# Patient Record
Sex: Male | Born: 1980 | Race: White | Hispanic: No | Marital: Married | Smoking: Current every day smoker
Health system: Southern US, Community
[De-identification: ages and names within clinical notes are randomized; demographics above are authoritative.]

## PROBLEM LIST (undated history)

## (undated) HISTORY — PX: OTHER SURGICAL HISTORY: SHX169

## (undated) HISTORY — PX: TONSILLECTOMY: SUR1361

---

## 2011-04-07 ENCOUNTER — Emergency Department: Payer: Self-pay | Admitting: Emergency Medicine

## 2018-01-31 ENCOUNTER — Emergency Department (HOSPITAL_COMMUNITY): Payer: Self-pay

## 2018-01-31 ENCOUNTER — Encounter (HOSPITAL_COMMUNITY): Payer: Self-pay | Admitting: Emergency Medicine

## 2018-01-31 ENCOUNTER — Other Ambulatory Visit: Payer: Self-pay

## 2018-01-31 ENCOUNTER — Emergency Department (HOSPITAL_COMMUNITY)
Admission: EM | Admit: 2018-01-31 | Discharge: 2018-01-31 | Disposition: A | Discharge status: Home / Self Care | Payer: Self-pay | Attending: Emergency Medicine | Admitting: Emergency Medicine

## 2018-01-31 DIAGNOSIS — F17200 Nicotine dependence, unspecified, uncomplicated: Secondary | ICD-10-CM | POA: Insufficient documentation

## 2018-01-31 DIAGNOSIS — M25561 Pain in right knee: Secondary | ICD-10-CM | POA: Insufficient documentation

## 2018-01-31 DIAGNOSIS — G8929 Other chronic pain: Secondary | ICD-10-CM

## 2018-01-31 MED ORDER — NAPROXEN 375 MG PO TABS: 375.0000 mg | ORAL_TABLET | Freq: Two times a day (BID) | ORAL | 0 refills | Status: DC

## 2018-01-31 NOTE — ED Notes (Signed)
Pt able to ambulate from triage to room assigned

## 2018-01-31 NOTE — ED Provider Notes (Signed)
[facility] EMERGENCY DEPARTMENT Provider Note   CSN: [id number] Arrival date & time: 01/31/18  1137     History   Chief Complaint Chief Complaint  Patient presents with  . Knee Pain    HPI Glen West is a 37 y.o. male with no significant past medical history presents emergency department today for right knee pain x1 year.  Patient states that he is unsure of any preceding trauma, falls.  He reports that over the last 1 year his knee has been popping with walking and he also feels a clicking sensation.  He denies ever giving way on him.  He reports it is worse when he squats down.  He has not tried anything for his symptoms.  He reports that he came in today because his girlfriend made him.  He is never followed up with anybody for this or been seen by anybody.  He denies any fever, chills, IV drug use, lower leg swelling, numbness/tingling/weakness.  HPI  History reviewed. No pertinent past medical history.  There are no active problems to display for this patient.   Past Surgical History:  Procedure Laterality Date  . left arm surgery          Home Medications    Prior to Admission medications   Not on File    Family History History reviewed. No pertinent family history.  Social History Social History   Tobacco Use  . Smoking status: Current Every Day Smoker  . Smokeless tobacco: Never Used  Substance Use Topics  . Alcohol use: Yes    Comment: daily/beer  . Drug use: Not Currently    Types: Marijuana     Allergies   Patient has no allergy information on record.   Review of Systems Review of Systems  All other systems reviewed and are negative.    Physical Exam Updated Vital Signs BP 131/82 (BP Location: Right Arm)   Pulse 60   Temp 97.6 F (36.4 C) (Oral)   Resp 16   Ht  (1.854 m)   Wt 79.4 kg (175 lb)   SpO2 100%   BMI 23.09 kg/m   Physical Exam  Constitutional: He appears well-developed and well-nourished.  HENT:  Head:  Normocephalic and atraumatic.  Right Ear: External ear normal.  Left Ear: External ear normal.  Eyes: Conjunctivae are normal. Right eye exhibits no discharge. Left eye exhibits no discharge. No scleral icterus.  Cardiovascular:  Pulses:      Dorsalis pedis pulses are 2+ on the right side.       Posterior tibial pulses are 2+ on the right side.  No lower extremity edema  Pulmonary/Chest: Effort normal. No respiratory distress.  Musculoskeletal:  Mild medial joint effusion. No obvious deformity. No skin swelling, erythema, heat, fluctuance or break of the skin. TTP over medial joint line. Active and passive flexion and extension intact and full. Negative Lachman's test. Negative anterior/poster drawer bilaterally. Equivocal McMurray's test. No varus or valgus laxity or locking. No TTP of hips or ankles. Compartments soft. Neurovascularly intact distally to site of injury.   Neurological: He is alert. He has normal strength. No sensory deficit.  Skin: Skin is warm, dry and intact. Capillary refill takes less than 2 seconds. No erythema. No pallor.  No overlying erythema or heat.  No drainage.  Psychiatric: He has a normal mood and affect.  Nursing note and vitals reviewed.    ED Treatments / Results  Labs (all labs ordered are listed, but only  abnormal results are displayed) Labs Reviewed - No data to display  EKG None  Radiology Dg Knee Complete 4 Views Right  Result Date: 01/31/2018 CLINICAL DATA:  Medial right knee pain and swelling.  No injury. EXAM: RIGHT KNEE - COMPLETE 4+ VIEW COMPARISON:  None. FINDINGS: No acute fracture or dislocation. Probable small joint effusion. Joint spaces are preserved. Bone mineralization is normal. Soft tissues are unremarkable. IMPRESSION: 1. Small joint effusion.  No acute osseous abnormality. Electronically Signed   By: Obie Dredge M.D.   On: 01/31/2018 12:13    Procedures Procedures (including critical care time)  Medications Ordered in  ED Medications - No data to display   Initial Impression / Assessment and Plan / ED Course  I have reviewed the triage vital signs and the nursing notes.  Pertinent labs & imaging results that were available during my care of the patient were reviewed by me and considered in my medical decision making (see chart for details).     37 y.o. male with right knee pain x 1 year with clicking and popping. He is NVI. Compartments are soft. Patient without evidence of septic joint with normal rom, no overlying erythema or heat and afebrile. Patient X-Ray negative for obvious fracture or dislocation. Possible meniscus injury but will refer to orthopedics for further evaluation. Patient given brace while in ED, conservative therapy recommended and discussed. Patient will be dc home & is agreeable with above plan.  Final Clinical Impressions(s) / ED Diagnoses   Final diagnoses:  Chronic pain of right knee    ED Discharge Orders        Ordered    naproxen (NAPROSYN) 375 MG tablet  2 times daily     01/31/18 1306       Princella Pellegrini 01/31/18 1308    Donnetta Hutching, MD 02/01/18 [phone]

## 2018-01-31 NOTE — ED Triage Notes (Signed)
Pt c/o right knee pain. Heard a pop yesterday. Denies injury. Hx of

## 2018-01-31 NOTE — Discharge Instructions (Addendum)
Your xray was negative for fracture. Your exam is not consistent with an infected joint.  It is possible this may be related to the cartilage in your knee.  You will have to follow with orthopedics for further evaluation of this.  I provided you with a knee brace in the department.  Please take medication as prescribed. If you develop worsening or new concerning symptoms you can return to the emergency department for re-evaluation.

## 2018-01-31 NOTE — ED Notes (Signed)
Pt states right knee has been popping for a year, pt denies any known injury to knee. Ice pack applied.

## 2018-01-31 NOTE — ED Notes (Signed)
Patient transported to X-ray 

## 2019-04-19 ENCOUNTER — Emergency Department
Admission: EM | Admit: 2019-04-19 | Discharge: 2019-04-19 | Disposition: A | Discharge status: Home / Self Care | Payer: 59 | Attending: Student in an Organized Health Care Education/Training Program | Admitting: Student in an Organized Health Care Education/Training Program

## 2019-04-19 ENCOUNTER — Emergency Department: Payer: 59

## 2019-04-19 ENCOUNTER — Other Ambulatory Visit: Payer: Self-pay

## 2019-04-19 ENCOUNTER — Encounter: Payer: Self-pay | Admitting: Emergency Medicine

## 2019-04-19 DIAGNOSIS — Y9289 Other specified places as the place of occurrence of the external cause: Secondary | ICD-10-CM | POA: Diagnosis not present

## 2019-04-19 DIAGNOSIS — S6992XA Unspecified injury of left wrist, hand and finger(s), initial encounter: Secondary | ICD-10-CM | POA: Diagnosis present

## 2019-04-19 DIAGNOSIS — S61211A Laceration without foreign body of left index finger without damage to nail, initial encounter: Secondary | ICD-10-CM | POA: Diagnosis not present

## 2019-04-19 DIAGNOSIS — W312XXA Contact with powered woodworking and forming machines, initial encounter: Secondary | ICD-10-CM | POA: Diagnosis not present

## 2019-04-19 DIAGNOSIS — Y998 Other external cause status: Secondary | ICD-10-CM | POA: Diagnosis not present

## 2019-04-19 DIAGNOSIS — F17200 Nicotine dependence, unspecified, uncomplicated: Secondary | ICD-10-CM | POA: Insufficient documentation

## 2019-04-19 DIAGNOSIS — Y9389 Activity, other specified: Secondary | ICD-10-CM | POA: Insufficient documentation

## 2019-04-19 DIAGNOSIS — Z23 Encounter for immunization: Secondary | ICD-10-CM | POA: Diagnosis not present

## 2019-04-19 MED ORDER — BUPIVACAINE HCL (PF) 0.5 % IJ SOLN
30.0000 mL | Freq: Once | INTRAMUSCULAR | Status: AC
  Administered 2019-04-19: 30 mL
  Filled 2019-04-19: qty 30

## 2019-04-19 MED ORDER — CEPHALEXIN 500 MG PO CAPS
500.0000 mg | ORAL_CAPSULE | Freq: Once | ORAL | Status: AC
  Administered 2019-04-19: 500 mg via ORAL
  Filled 2019-04-19: qty 1

## 2019-04-19 MED ORDER — CEPHALEXIN 500 MG PO CAPS: 500.0000 mg | ORAL_CAPSULE | Freq: Three times a day (TID) | ORAL | 0 refills | Status: AC

## 2019-04-19 MED ORDER — TETANUS-DIPHTH-ACELL PERTUSSIS 5-2.5-18.5 LF-MCG/0.5 IM SUSP
0.5000 mL | Freq: Once | INTRAMUSCULAR | Status: AC
  Administered 2019-04-19: 04:00:00 0.5 mL via INTRAMUSCULAR
  Filled 2019-04-19: qty 0.5

## 2019-04-19 MED ORDER — CEPHALEXIN 500 MG PO CAPS: 500.0000 mg | ORAL_CAPSULE | Freq: Three times a day (TID) | ORAL | 0 refills | Status: DC

## 2019-04-19 MED ORDER — BACITRACIN ZINC 500 UNIT/GM EX OINT
TOPICAL_OINTMENT | Freq: Once | CUTANEOUS | Status: AC
  Administered 2019-04-19: 1 via TOPICAL
  Filled 2019-04-19: qty 0.9

## 2019-04-19 MED ORDER — OXYCODONE-ACETAMINOPHEN 5-325 MG PO TABS
1.0000 | ORAL_TABLET | Freq: Once | ORAL | Status: AC
  Administered 2019-04-19: 04:00:00 1 via ORAL
  Filled 2019-04-19: qty 1

## 2019-04-19 NOTE — ED Triage Notes (Signed)
Patient states that he cut his left second finger on a saw about an hour ago. Bleeding controlled at this time.

## 2019-04-19 NOTE — ED Provider Notes (Signed)
[facility] Emergency Department Provider Note    First MD Initiated Contact with Patient 04/19/19 [phone]     (approximate)  I have reviewed the triage vital signs and the nursing notes.   HISTORY  Chief Complaint Laceration    HPI Glen West is a 38 y.o. male presents for evaluation of injury to nondominant left index finger that was sustained tonight around midnight as the patient was cutting copper piping with a band saw.  States the pain is mild to moderate.  Did have some tingling but does not have any numbness or tingling to the left finger at this time.  Tetanus is not up-to-date.  History reviewed. No pertinent past medical history. No family history on file. Past Surgical History:  Procedure Laterality Date  . left arm surgery     There are no active problems to display for this patient.     Prior to Admission medications   Medication Sig Start Date End Date Taking? Authorizing Provider  cephALEXin (KEFLEX) 500 MG capsule Take 1 capsule (500 mg total) by mouth 3 (three) times daily for 7 days. 04/19/19 04/26/19  Merlyn Lot, MD  naproxen (NAPROSYN) 375 MG tablet Take 1 tablet (375 mg total) by mouth 2 (two) times daily. 01/31/18   Maczis, Barth Kirks, PA-C    Allergies Patient has no known allergies.    Social History Social History   Tobacco Use  . Smoking status: Current Every Day Smoker  . Smokeless tobacco: Never Used  Substance Use Topics  . Alcohol use: Yes    Comment: 6 pack of beer daily  . Drug use: Not Currently    Types: Marijuana    Review of Systems Patient denies headaches, rhinorrhea, blurry vision, numbness, shortness of breath, chest pain, edema, cough, abdominal pain, nausea, vomiting, diarrhea, dysuria, fevers, rashes or hallucinations unless otherwise stated above in HPI. ____________________________________________   PHYSICAL EXAM:  VITAL SIGNS: Vitals:   04/19/19 0121  BP: (!) 154/85  Pulse:  76  Resp: 18  Temp: (!) 97.4 F (36.3 C)  SpO2: 99%    Constitutional: Alert and oriented. Well appearing and in no acute distress. Eyes: Conjunctivae are normal.  Head: Atraumatic. Nose: No congestion/rhinnorhea. Mouth/Throat: Mucous membranes are moist.   Neck: Painless ROM.  Cardiovascular:   Good peripheral circulation. Respiratory: Normal respiratory effort.  No retractions.  Gastrointestinal: Soft and nontender.  Musculoskeletal: 2 cm laceration over the radial aspect of the proximal PIP joint the left hand.  Flexor and extensor mechanism is intact.  Neurovascularly intact distally.  No foreign bodies identified. Neurologic:  Normal speech and language. No gross focal neurologic deficits are appreciated.  Skin:  Skin is warm, dry and intact. No rash noted. Psychiatric: Mood and affect are normal. Speech and behavior are normal.  ____________________________________________   LABS (all labs ordered are listed, but only abnormal results are displayed)  No results found for this or any previous visit (from the past 24 hour(s)). ____________________________________________ ____________________________________________  [id number]  I personally reviewed all radiographic images ordered to evaluate for the above acute complaints and reviewed radiology reports and findings.  These findings were personally discussed with the patient.  Please see medical record for radiology report.  ____________________________________________   PROCEDURES  Procedure(s) performed:  Marland KitchenMarland KitchenLaceration Repair  Date/Time: 04/19/2019 4:22 AM Performed by: Merlyn Lot, MD Authorized by: Merlyn Lot, MD   Consent:    Consent obtained:  Verbal   Consent given by:  Patient   Risks  discussed:  Infection, nerve damage, need for additional repair, poor wound healing and poor cosmetic result Anesthesia (see MAR for exact dosages):    Anesthesia method:  Nerve block   Block needle gauge:  25 G    Block anesthetic:  Bupivacaine 0.5% w/o epi   Block technique:  Ring   Block injection procedure:  Introduced needle   Block outcome:  Anesthesia achieved Laceration details:    Location:  Finger   Finger location:  L index finger   Length (cm):  2   Depth (mm):  4 Repair type:    Repair type:  Intermediate Exploration:    Wound exploration: wound explored through full range of motion     Contaminated: yes   Treatment:    Area cleansed with:  Betadine   Amount of cleaning:  Extensive   Irrigation solution:  Sterile saline   Irrigation method:  Syringe   Visualized foreign bodies/material removed: no   Skin repair:    Repair method:  Sutures   Suture size:  5-0   Suture material:  Nylon   Suture technique:  Simple interrupted   Number of sutures:  5 Approximation:    Approximation:  Loose Post-procedure details:    Dressing:  Antibiotic ointment      Critical Care performed: no ____________________________________________   INITIAL IMPRESSION / ASSESSMENT AND PLAN / ED COURSE  Pertinent labs & imaging results that were available during my care of the patient were reviewed by me and considered in my medical decision making (see chart for details).  DDX: laceration, fracture, fb  Cheron SchaumannCurtis W Havens is a 38 y.o. who presents to the ED with laceration as described above tetanus was updated.  Extensor and flexion mechanisms are intact.  Have a high suspicion that this did violate the joint capsule therefore it was extensively irrigated with Betadine and saline.  Loosely approximated.  Will be put on antibiotics.  Will give referral to Ortho hand for close outpatient follow-up.    The patient was evaluated in Emergency Department today for the symptoms described in the history of present illness. He/she was evaluated in the context of the global COVID-19 pandemic, which necessitated consideration that the patient might be at risk for infection with the SARS-CoV-2 virus that  causes COVID-19. Institutional protocols and algorithms that pertain to the evaluation of patients at risk for COVID-19 are in a state of rapid change based on information released by regulatory bodies including the CDC and federal and state organizations. These policies and algorithms were followed during the patient's care in the ED.    ____________________________________________   FINAL CLINICAL IMPRESSION(S) / ED DIAGNOSES  Final diagnoses:  Laceration of left index finger without damage to nail, foreign body presence unspecified, initial encounter      NEW MEDICATIONS STARTED DURING THIS VISIT:  New Prescriptions   CEPHALEXIN (KEFLEX) 500 MG CAPSULE    Take 1 capsule (500 mg total) by mouth 3 (three) times daily for 7 days.     Note:  This document was prepared using Dragon voice recognition software and may include unintentional dictation errors.     Willy Eddyobinson, Llewellyn Choplin, MD 04/19/19 Glynis [phone]

## 2019-04-19 NOTE — Discharge Instructions (Signed)
[facility] for follow up with [facility] specialist.

## 2019-04-19 NOTE — ED Notes (Signed)
Pt informed he cannot drive for 4 hours after percocet administration. Pt verbalizes understanding, states has a driver.

## 2019-05-26 ENCOUNTER — Other Ambulatory Visit: Payer: Self-pay

## 2019-05-26 ENCOUNTER — Ambulatory Visit
Admission: EM | Admit: 2019-05-26 | Discharge: 2019-05-26 | Disposition: A | Discharge status: Home / Self Care | Payer: 59 | Attending: Emergency Medicine | Admitting: Emergency Medicine

## 2019-05-26 DIAGNOSIS — Z20822 Contact with and (suspected) exposure to covid-19: Secondary | ICD-10-CM

## 2019-05-26 DIAGNOSIS — R0981 Nasal congestion: Secondary | ICD-10-CM

## 2019-05-26 DIAGNOSIS — R05 Cough: Secondary | ICD-10-CM

## 2019-05-26 DIAGNOSIS — Z20828 Contact with and (suspected) exposure to other viral communicable diseases: Secondary | ICD-10-CM | POA: Diagnosis not present

## 2019-05-26 DIAGNOSIS — J069 Acute upper respiratory infection, unspecified: Secondary | ICD-10-CM

## 2019-05-26 MED ORDER — FLUTICASONE PROPIONATE 50 MCG/ACT NA SUSP: 2.0000 | Freq: Every day | NASAL | 0 refills | Status: DC

## 2019-05-26 MED ORDER — BENZONATATE 100 MG PO CAPS: 100.0000 mg | ORAL_CAPSULE | Freq: Three times a day (TID) | ORAL | 0 refills | Status: DC

## 2019-05-26 MED ORDER — CETIRIZINE-PSEUDOEPHEDRINE ER 5-120 MG PO TB12: 1.0000 | ORAL_TABLET | Freq: Every day | ORAL | 0 refills | Status: DC

## 2019-05-26 NOTE — ED Provider Notes (Signed)
[facility]   [id number] 05/26/19 Arrival Time: 9379   CC: URI symptoms; COVID testing  SUBJECTIVE: History from: patient.  Glen West is a 38 y.o. male who presents with runny nose, congestion, sore throat, and dry cough x 6 days.  Denies sick exposure to COVID, flu or strep.  Denies recent travel.  Has NOT tried OTC medications.  Denies aggravating factors.  Reports previous symptoms in the past related to a cold.   Complains of subjective fever, fatigue, mild wheezes and SOB.  Denies chest pain, chest pressure, nausea, vomiting, changes in bowel or bladder habits.    ROS: As per HPI.  All other pertinent ROS negative.     History reviewed. No pertinent past medical history. Past Surgical History:  Procedure Laterality Date  . left arm surgery     No Known Allergies No current facility-administered medications on file prior to encounter.    No current outpatient medications on file prior to encounter.   Social History   Socioeconomic History  . Marital status: Single    Spouse name: Not on file  . Number of children: Not on file  . Years of education: Not on file  . Highest education level: Not on file  Occupational History  . Not on file  Social Needs  . Financial resource strain: Not on file  . Food insecurity    Worry: Not on file    Inability: Not on file  . Transportation needs    Medical: Not on file    Non-medical: Not on file  Tobacco Use  . Smoking status: Current Every Day Smoker  . Smokeless tobacco: Never Used  Substance and Sexual Activity  . Alcohol use: Yes    Comment: 6 pack of beer daily  . Drug use: Not Currently    Types: Marijuana  . Sexual activity: Not on file  Lifestyle  . Physical activity    Days per week: Not on file    Minutes per session: Not on file  . Stress: Not on file  Relationships  . Social Herbalist on phone: Not on file    Gets together: Not on file    Attends religious service: Not on file     Active member of club or organization: Not on file    Attends meetings of clubs or organizations: Not on file    Relationship status: Not on file  . Intimate partner violence    Fear of current or ex partner: Not on file    Emotionally abused: Not on file    Physically abused: Not on file    Forced sexual activity: Not on file  Other Topics Concern  . Not on file  Social History Narrative  . Not on file   History reviewed. No pertinent family history.  OBJECTIVE:  Vitals:   05/26/19 1104  BP: 124/81  Pulse: 80  Resp: 18  Temp: 97.7 F (36.5 C)  SpO2: 97%    General appearance: alert; appears fatigued, but nontoxic; speaking in full sentences and tolerating own secretions HEENT: NCAT; Ears: EACs clear, TMs pearly gray; Eyes: PERRL.  EOM grossly intact. Nose: nares patent without rhinorrhea, Dentition: poor;  Throat: oropharynx clear, tonsils non erythematous or enlarged, uvula midline  Neck: supple without LAD Lungs: unlabored respirations, symmetrical air entry; cough: mild; no respiratory distress; diffuse expiratory wheezes and crackles about bilateral lung fields Heart: regular rate and rhythm.  Radial pulses 2+ symmetrical bilaterally. Cap refill <  2 seconds Skin: warm and dry Psychological: alert and cooperative; normal mood and affect  ASSESSMENT & PLAN:  1. Suspected Covid-19 Virus Infection   2. Viral URI with cough     Meds ordered this encounter  Medications  . cetirizine-pseudoephedrine (ZYRTEC-D) 5-120 MG tablet    Sig: Take 1 tablet by mouth daily.    Dispense:  30 tablet    Refill:  0    Order Specific Question:   Supervising Provider    Answer:   Eustace MooreNELSON, YVONNE SUE [[id number]]  . fluticasone (FLONASE) 50 MCG/ACT nasal spray    Sig: Place 2 sprays into both nostrils daily.    Dispense:  16 g    Refill:  0    Order Specific Question:   Supervising Provider    Answer:   Eustace MooreNELSON, YVONNE SUE [[id number]]  . benzonatate (TESSALON) 100 MG capsule    Sig:  Take 1 capsule (100 mg total) by mouth every 8 (eight) hours.    Dispense:  21 capsule    Refill:  0    Order Specific Question:   Supervising Provider    Answer:   Eustace MooreELSON, YVONNE SUE [[id number]]    COVID testing ordered.    In the meantime: You should remain isolated in your home for 10 days from symptom onset AND greater than 72 hours after symptoms resolution (absence of fever without the use of fever-reducing medication and improvement in respiratory symptoms), whichever is longer Get plenty of rest and push fluids Tessalon Perles prescribed for cough Zyrtec-D prescribed for nasal congestion, runny nose, and/or sore throat Flonase prescribed for nasal congestion and runny nose Use medications daily for symptom relief Use OTC medications like ibuprofen or tylenol as needed fever or pain Follow up with PCP as needed Call or go to the ED if you have any new or worsening symptoms such as fever, worsening cough, shortness of breath, chest tightness, chest pain, turning blue, changes in mental status, etc...  Instructed patient to return in 1-2 days if symptoms were not improving.  May need a chest x-ray at that time.     Reviewed expectations re: course of current medical issues. Questions answered. Outlined signs and symptoms indicating need for more acute intervention. Patient verbalized understanding. After Visit Summary given.         Rennis HardingWurst, Glen Brissette, PA-C 05/26/19 1504

## 2019-05-26 NOTE — ED Triage Notes (Signed)
Pt having cough and nasal congestion for past week wants covid testing

## 2019-05-26 NOTE — Discharge Instructions (Signed)
COVID testing ordered.    In the meantime: You should remain isolated in your home for 10 days from symptom onset AND greater than 72 hours after symptoms resolution (absence of fever without the use of fever-reducing medication and improvement in respiratory symptoms), whichever is longer Get plenty of rest and push fluids Tessalon Perles prescribed for cough Zyrtec-D prescribed for nasal congestion, runny nose, and/or sore throat Flonase prescribed for nasal congestion and runny nose Use medications daily for symptom relief Use OTC medications like ibuprofen or tylenol as needed fever or pain Follow up with PCP as needed Call or go to the ED if you have any new or worsening symptoms such as fever, worsening cough, shortness of breath, chest tightness, chest pain, turning blue, changes in mental status, etc..Marland Kitchen

## 2019-05-27 LAB — NOVEL CORONAVIRUS, NAA: SARS-CoV-2, NAA: NOT DETECTED

## 2019-05-28 ENCOUNTER — Encounter (HOSPITAL_COMMUNITY): Payer: Self-pay

## 2019-07-04 ENCOUNTER — Other Ambulatory Visit: Payer: Self-pay

## 2019-07-04 DIAGNOSIS — Z20822 Contact with and (suspected) exposure to covid-19: Secondary | ICD-10-CM

## 2019-07-06 LAB — NOVEL CORONAVIRUS, NAA: SARS-CoV-2, NAA: NOT DETECTED

## 2020-08-17 ENCOUNTER — Emergency Department (HOSPITAL_COMMUNITY)
Admission: EM | Admit: 2020-08-17 | Discharge: 2020-08-17 | Disposition: A | Discharge status: Home / Self Care | Payer: BC Managed Care – PPO | Attending: Emergency Medicine | Admitting: Emergency Medicine

## 2020-08-17 ENCOUNTER — Encounter (HOSPITAL_COMMUNITY): Payer: Self-pay | Admitting: *Deleted

## 2020-08-17 ENCOUNTER — Emergency Department (HOSPITAL_COMMUNITY): Payer: BC Managed Care – PPO

## 2020-08-17 ENCOUNTER — Other Ambulatory Visit: Payer: Self-pay

## 2020-08-17 DIAGNOSIS — J181 Lobar pneumonia, unspecified organism: Secondary | ICD-10-CM | POA: Insufficient documentation

## 2020-08-17 DIAGNOSIS — R109 Unspecified abdominal pain: Secondary | ICD-10-CM | POA: Insufficient documentation

## 2020-08-17 DIAGNOSIS — Z20822 Contact with and (suspected) exposure to covid-19: Secondary | ICD-10-CM | POA: Insufficient documentation

## 2020-08-17 DIAGNOSIS — R300 Dysuria: Secondary | ICD-10-CM | POA: Diagnosis not present

## 2020-08-17 DIAGNOSIS — F17210 Nicotine dependence, cigarettes, uncomplicated: Secondary | ICD-10-CM | POA: Insufficient documentation

## 2020-08-17 DIAGNOSIS — R350 Frequency of micturition: Secondary | ICD-10-CM | POA: Insufficient documentation

## 2020-08-17 DIAGNOSIS — R509 Fever, unspecified: Secondary | ICD-10-CM | POA: Diagnosis present

## 2020-08-17 DIAGNOSIS — J189 Pneumonia, unspecified organism: Secondary | ICD-10-CM

## 2020-08-17 LAB — BASIC METABOLIC PANEL
Anion gap: 10 (ref 5–15)
BUN: 7 mg/dL (ref 6–20)
CO2: 30 mmol/L (ref 22–32)
Calcium: 9 mg/dL (ref 8.9–10.3)
Chloride: 94 mmol/L — ABNORMAL LOW (ref 98–111)
Creatinine, Ser: 0.65 mg/dL (ref 0.61–1.24)
GFR, Estimated: >60 mL/min (ref >60)
Glucose, Bld: 96 mg/dL (ref 70–99)
Potassium: 4 mmol/L (ref 3.5–5.1)
Sodium: 134 mmol/L — ABNORMAL LOW (ref 135–145)

## 2020-08-17 LAB — URINALYSIS, ROUTINE W REFLEX MICROSCOPIC
Bacteria, UA: NONE SEEN
Bilirubin Urine: NEGATIVE
Glucose, UA: NEGATIVE mg/dL
Hgb urine dipstick: NEGATIVE
Ketones, ur: NEGATIVE mg/dL
Leukocytes,Ua: NEGATIVE
Nitrite: POSITIVE — AB
Protein, ur: NEGATIVE mg/dL
Specific Gravity, Urine: 1.018 (ref 1.005–1.030)
pH: 5 (ref 5.0–8.0)

## 2020-08-17 LAB — CBC WITH DIFFERENTIAL/PLATELET
Abs Immature Granulocytes: 0.08 10*3/uL — ABNORMAL HIGH (ref 0.00–0.07)
Basophils Absolute: 0.1 10*3/uL (ref 0.0–0.1)
Basophils Relative: 1 %
Eosinophils Absolute: 0.1 10*3/uL (ref 0.0–0.5)
Eosinophils Relative: 1 %
HCT: 41.6 % (ref 39.0–52.0)
Hemoglobin: 14.2 g/dL (ref 13.0–17.0)
Immature Granulocytes: 1 %
Lymphocytes Relative: 23 %
Lymphs Abs: 3.7 10*3/uL (ref 0.7–4.0)
MCH: 32.3 pg (ref 26.0–34.0)
MCHC: 34.1 g/dL (ref 30.0–36.0)
MCV: 94.5 fL (ref 80.0–100.0)
Monocytes Absolute: 1.1 10*3/uL — ABNORMAL HIGH (ref 0.1–1.0)
Monocytes Relative: 7 %
Neutro Abs: 11 10*3/uL — ABNORMAL HIGH (ref 1.7–7.7)
Neutrophils Relative %: 67 %
Platelets: 262 10*3/uL (ref 150–400)
RBC: 4.4 MIL/uL (ref 4.22–5.81)
RDW: 11.3 % — ABNORMAL LOW (ref 11.5–15.5)
WBC: 16.1 10*3/uL — ABNORMAL HIGH (ref 4.0–10.5)
nRBC: 0 % (ref 0.0–0.2)

## 2020-08-17 LAB — RESP PANEL BY RT-PCR (RSV, FLU A&B, COVID)  RVPGX2
Influenza A by PCR: NEGATIVE
Influenza B by PCR: NEGATIVE
Resp Syncytial Virus by PCR: NEGATIVE
SARS Coronavirus 2 by RT PCR: NEGATIVE

## 2020-08-17 LAB — HEPATIC FUNCTION PANEL
ALT: 14 U/L (ref 0–44)
AST: 12 U/L — ABNORMAL LOW (ref 15–41)
Albumin: 3.3 g/dL — ABNORMAL LOW (ref 3.5–5.0)
Alkaline Phosphatase: 87 U/L (ref 38–126)
Bilirubin, Direct: 0.1 mg/dL (ref 0.0–0.2)
Indirect Bilirubin: 0.2 mg/dL — ABNORMAL LOW (ref 0.3–0.9)
Total Bilirubin: 0.3 mg/dL (ref 0.3–1.2)
Total Protein: 7.5 g/dL (ref 6.5–8.1)

## 2020-08-17 LAB — LIPASE, BLOOD: Lipase: 20 U/L (ref 11–51)

## 2020-08-17 MED ORDER — DOXYCYCLINE HYCLATE 100 MG PO CAPS: 100.0000 mg | ORAL_CAPSULE | Freq: Two times a day (BID) | ORAL | 0 refills | Status: AC

## 2020-08-17 MED ORDER — SODIUM CHLORIDE 0.9 % IV BOLUS
1000.0000 mL | Freq: Once | INTRAVENOUS | Status: AC
  Administered 2020-08-17: 1000 mL via INTRAVENOUS

## 2020-08-17 NOTE — ED Triage Notes (Signed)
Pt with RUQ pain x 1.5 wk.  Denies any N/V/D.

## 2020-08-17 NOTE — ED Provider Notes (Signed)
[facility] EMERGENCY DEPARTMENT Provider Note   CSN: [id number] Arrival date & time: 08/17/20  1123     History Chief Complaint  Patient presents with  . Abdominal Pain    Glen West is a 39 y.o. male.  HPI   Patient with no significant medical history presents to the emergency department with chief complaint of right-sided flank pain.  Patient states this started sometime last week and has progressively gotten worse.  He describes a sharp sensation in his right flank and he feels rating to his abdomen.  He denies nausea, vomiting, diarrhea, but does endorse fevers, chills, a dry cough as well as dysuria with frequent urination but denies penile discharge, testicular pain, hematuria.  He has no abdominal history, denies history of kidney stones, but does endorse frequent alcohol consumption 8-9 beers daily, smokes daily 1 pack but denies NSAID use has no history of stomach ulcers.  He denies seeing any blood in his stool, dark tarry stools.  Patient states he is vaccinated for COVID-19, denies recent sick contacts no recent travels.  He denies any alleviating factors.  Patient denies headaches, shortness of breath, chest pain, worsening pedal edema.  History reviewed. No pertinent past medical history.  There are no problems to display for this patient.   Past Surgical History:  Procedure Laterality Date  . left arm surgery         History reviewed. No pertinent family history.  Social History   Tobacco Use  . Smoking status: Current Every Day Smoker    Types: Cigarettes  . Smokeless tobacco: Never Used  Vaping Use  . Vaping Use: Never used  Substance Use Topics  . Alcohol use: Yes    Comment: 6 pack of beer daily  . Drug use: Not Currently    Types: Marijuana    Home Medications Prior to Admission medications   Medication Sig Start Date End Date Taking? Authorizing Provider  benzonatate (TESSALON) 100 MG capsule Take 1 capsule (100 mg total) by mouth every  8 (eight) hours. 05/26/19   Wurst, [place], PA-C  cetirizine-pseudoephedrine (ZYRTEC-D) 5-120 MG tablet Take 1 tablet by mouth daily. 05/26/19   Wurst, [place], PA-C  doxycycline (VIBRAMYCIN) 100 MG capsule Take 1 capsule (100 mg total) by mouth 2 (two) times daily for 7 days. 08/17/20 08/24/20  Carroll Sage, PA-C  fluticasone (FLONASE) 50 MCG/ACT nasal spray Place 2 sprays into both nostrils daily. 05/26/19   Wurst, [place], PA-C    Allergies    Patient has no known allergies.  Review of Systems   Review of Systems  Constitutional: Positive for chills and fever.  HENT: Negative for congestion and sore throat.   Eyes: Negative for visual disturbance.  Respiratory: Positive for cough. Negative for shortness of breath.   Cardiovascular: Negative for chest pain.  Gastrointestinal: Negative for abdominal pain, diarrhea, nausea and vomiting.  Genitourinary: Positive for dysuria, flank pain and frequency. Negative for enuresis, penile swelling and scrotal swelling.  Musculoskeletal: Negative for back pain.  Skin: Negative for rash.  Neurological: Negative for dizziness and headaches.  Hematological: Does not bruise/bleed easily.    Physical Exam Updated Vital Signs BP 99/70 (BP Location: Right Arm)   Pulse 95   Temp 98.4 F (36.9 C) (Oral)   Resp 19   Ht 6\' 2"  (1.88 m)   Wt 74.8 kg   SpO2 100%   BMI 21.18 kg/m   Physical Exam Vitals and nursing note reviewed.  Constitutional:  General: He is not in acute distress.    Appearance: He is not ill-appearing.  HENT:     Head: Normocephalic and atraumatic.     Nose: No congestion.     Mouth/Throat:     Mouth: Mucous membranes are dry.     Pharynx: Oropharynx is clear. No oropharyngeal exudate or posterior oropharyngeal erythema.  Eyes:     Conjunctiva/sclera: Conjunctivae normal.  Cardiovascular:     Rate and Rhythm: Normal rate and regular rhythm.     Pulses: Normal pulses.     Heart sounds: No murmur heard.  No  friction rub. No gallop.   Pulmonary:     Effort: No respiratory distress.     Breath sounds: Rales present. No wheezing or rhonchi.     Comments: Patient had good rise and fall during respiration, he had noted right lower rales lower lobe, no other abnormalities noted. Abdominal:     Palpations: Abdomen is soft.     Tenderness: There is no abdominal tenderness. There is right CVA tenderness. There is no left CVA tenderness.  Musculoskeletal:     Right lower leg: No edema.     Left lower leg: No edema.     Comments: Patient moving all 4 extremities at difficulty.  Skin:    General: Skin is warm and dry.  Neurological:     Mental Status: He is alert.     Comments: Patient is having no difficulty with word finding.  Psychiatric:        Mood and Affect: Mood normal.     ED Results / Procedures / Treatments   Labs (all labs ordered are listed, but only abnormal results are displayed) Labs Reviewed  CBC WITH DIFFERENTIAL/PLATELET - Abnormal; Notable for the following components:      Result Value   WBC 16.1 (*)    RDW 11.3 (*)    Neutro Abs 11.0 (*)    Monocytes Absolute 1.1 (*)    Abs Immature Granulocytes 0.08 (*)    All other components within normal limits  BASIC METABOLIC PANEL - Abnormal; Notable for the following components:   Sodium 134 (*)    Chloride 94 (*)    All other components within normal limits  URINALYSIS, ROUTINE W REFLEX MICROSCOPIC - Abnormal; Notable for the following components:   Color, Urine AMBER (*)    Nitrite POSITIVE (*)    All other components within normal limits  RESP PANEL BY RT-PCR (RSV, FLU A&B, COVID)  RVPGX2  LIPASE, BLOOD  HEPATIC FUNCTION PANEL    EKG None  Radiology DG Chest Port 1 View  Result Date: 08/17/2020 CLINICAL DATA:  Pt states he is having RUQ pain onset for over a weeks. Pt denies cp, sob, fever or N/V. Pending Covid test. cough EXAM: PORTABLE CHEST 1 VIEW COMPARISON:  None. FINDINGS: Normal cardiac silhouette and  hyperinflated lungs. Chronic bronchitic markings centrally. Mild bronchiectasis. No focal infiltrate. No airspace disease. No pleural fluid or pneumothorax. IMPRESSION: 1. No acute cardiopulmonary process. 2. Hyperinflated lungs with chronic bronchitic markings. Electronically Signed   By: Genevive Bi M.D.   On: 08/17/2020 18:59   CT Renal Stone Study  Result Date: 08/17/2020 CLINICAL DATA:  Patient with right upper quadrant pain x1 0.5 weeks. EXAM: CT ABDOMEN AND PELVIS WITHOUT CONTRAST TECHNIQUE: Multidetector CT imaging of the abdomen and pelvis was performed following the standard protocol without IV contrast. COMPARISON:  None. FINDINGS: Lower chest: There is a large area of consolidation in the right  lower lobe. Additional scattered areas of consolidation are noted in the left lower lobe and right middle lobe.The heart size is normal. Hepatobiliary: The liver is normal. Normal gallbladder.There is no biliary ductal dilation. Pancreas: Normal contours without ductal dilatation. No peripancreatic fluid collection. Spleen: Unremarkable. Adrenals/Urinary Tract: --Adrenal glands: Unremarkable. --Right kidney/ureter: No hydronephrosis or radiopaque kidney stones. --Left kidney/ureter: No hydronephrosis or radiopaque kidney stones. --Urinary bladder: Unremarkable. Stomach/Bowel: --Stomach/Duodenum: No hiatal hernia or other gastric abnormality. Normal duodenal course and caliber. --Small bowel: Unremarkable. --Colon: Unremarkable. --Appendix: Normal. Vascular/Lymphatic: Normal course and caliber of the major abdominal vessels. --No retroperitoneal lymphadenopathy. --No mesenteric lymphadenopathy. --No pelvic or inguinal lymphadenopathy. Reproductive: Unremarkable Other: No ascites or free air. The abdominal wall is normal. Musculoskeletal. No acute displaced fractures. IMPRESSION: 1. Findings consistent with multifocal pneumonia with a large area of consolidation involving the right lower lobe. Follow-up to  radiologic resolution is recommended. 2. No acute intra-abdominal abnormality detected. There are no radiopaque kidney stones. There is no hydronephrosis. The appendix is normal. Electronically Signed   By: Katherine Mantle M.D.   On: 08/17/2020 19:02    Procedures Procedures (including critical care time)  Medications Ordered in ED Medications  sodium chloride 0.9 % bolus 1,000 mL (0 mLs Intravenous Stopped 08/17/20 2013)    ED Course  I have reviewed the triage vital signs and the nursing notes.  Pertinent labs & imaging results that were available during my care of the patient were reviewed by me and considered in my medical decision making (see chart for details).    MDM Rules/Calculators/A&P                          Patient presents with right-sided flank pain x1/2 weeks.  He was alert, does not appear in acute distress, vital signs reassuring.  Will obtain basic lab work, due to right-sided flank pain and difficulty with urination concern for possible UTI versus pyelonephritis versus kidney stone.  Will obtain CT imaging for further evaluation.  Will start patient on 1 L of fluids as he appears to be dehydrated my exam.  Patient is reassessed after providing him with a liter of fluids, patient states he feels fine still has some slight right lower flank pain.  No signs of respiratory distress on my exam, vital signs reassuring.  CBC shows leukocytosis of 16.1, no anemia noted.  BMP shows slight hyponatremia of 134, no metabolic acidosis, no AKI, no anion gap present.  Lipase is 20.  Respiratory panel negative for Covid, influenza A/B, RSV.  UA shows positive nitrates, no leukocytes, no white blood cells or bacteria noted.  Chest x-ray shows hyperinflated lungs with with chronic bronchiolitic markings.  CT renal shows findings consistent with multifocal pneumonia with large area of consolidation from the right lower lung.  No acute intra-abdominal abnormalities noted.  Low suspicion  for intra-abdominal abnormality requiring immediate intervention as abdomen was soft nontender to palpation, patient tolerating p.o., no acute findings noted on CT renal.  Low suspicion for pyelonephritis, UTI, kidney stones as imaging was negative, UA negative for leukocytes, bacteria or white blood cells.  There is noted nitrates but I doubt there is a overlying UTI at this time.  Suspect patient suffering from multifocal pneumonia which would explain patient's right flank pain with elevated leukocytosis.  Patient is not toxic appearing on exam, no new oxygen requirements, no indication for admission at this time.  Will discharge patient with antibiotics and have him follow-up  in 7 to 10 days for reevaluation.  Vital signs have remained stable, no indication for hospital admission. Patient given at home care as well strict return precautions.  Patient verbalized that they understood agreed to said plan.  Final Clinical Impression(s) / ED Diagnoses Final diagnoses:  Community acquired pneumonia of right lower lobe of lung    Rx / DC Orders ED Discharge Orders         Ordered    doxycycline (VIBRAMYCIN) 100 MG capsule  2 times daily        08/17/20 2003           Barnie DelFaulkner, Deby Adger J, PA-C 08/17/20 2033    Mancel BaleWentz, Elliott, MD 08/17/20 [phone]

## 2020-08-17 NOTE — Discharge Instructions (Signed)
Seen here with right lower flank pain.  Imaging reveals that you have a lower lobe pneumonia.  Start you on antibiotics please take as prescribed.  Please be aware this antibiotic can make you more susceptible to sunlight please wear something to protect your face.  Recommend over-the-counter pain medications like ibuprofen or Tylenol if 6 as needed please follow dosing back of bottle.  Please follow-up in the next 7 to 10 days for reevaluation of your pneumonia.  You may come back to this department, urgent care, your primary care provider.  Come back to the emergency department if you develop chest pain, shortness of breath, severe abdominal pain, uncontrolled nausea, vomiting, diarrhea.

## 2022-05-30 IMAGING — DX DG CHEST 1V PORT
1 series · 1 of 1 positions shown · non-contrast
Comparison: None.

CLINICAL DATA: Pt states he is having RUQ pain onset for over a
weeks. Pt denies cp, sob, fever or N/V. Pending Covid test. cough

EXAM:
PORTABLE CHEST 1 VIEW

[chest ap]
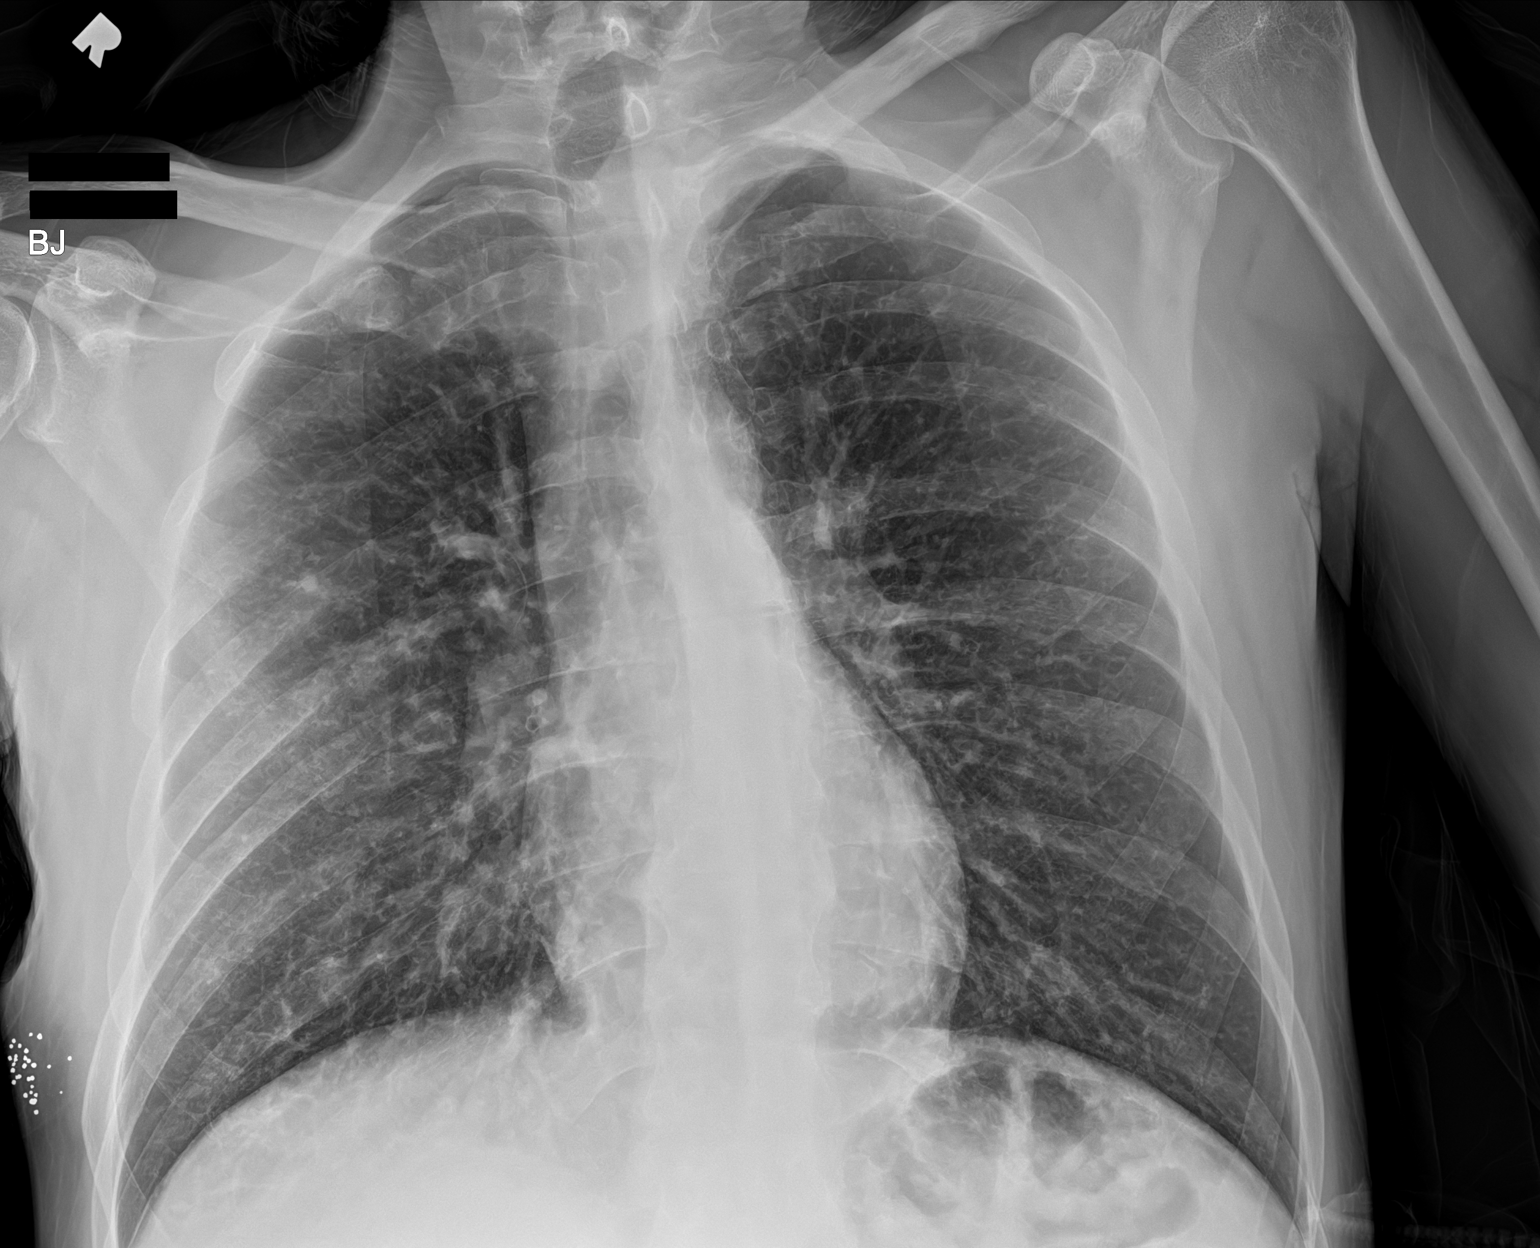

[1 of 1 positions shown; findings below may reference images not displayed]

FINDINGS: Normal cardiac silhouette and hyperinflated lungs. Chronic
bronchitic markings centrally. Mild bronchiectasis. No focal
infiltrate. No airspace disease. No pleural fluid or pneumothorax.
IMPRESSION: 1. No acute cardiopulmonary process.
2. Hyperinflated lungs with chronic bronchitic markings.

## 2023-12-30 ENCOUNTER — Emergency Department (HOSPITAL_COMMUNITY): Payer: MEDICAID

## 2023-12-30 ENCOUNTER — Encounter (HOSPITAL_COMMUNITY): Payer: Self-pay

## 2023-12-30 ENCOUNTER — Inpatient Hospital Stay (HOSPITAL_COMMUNITY)
Admission: EM | Admit: 2023-12-30 | Discharge: 2024-01-04 | DRG: 602 | Disposition: A | Discharge status: Home / Self Care | Payer: MEDICAID | Attending: Internal Medicine | Admitting: Internal Medicine

## 2023-12-30 ENCOUNTER — Other Ambulatory Visit: Payer: Self-pay

## 2023-12-30 DIAGNOSIS — L03311 Cellulitis of abdominal wall: Principal | ICD-10-CM | POA: Diagnosis present

## 2023-12-30 DIAGNOSIS — Z720 Tobacco use: Secondary | ICD-10-CM | POA: Diagnosis present

## 2023-12-30 DIAGNOSIS — E860 Dehydration: Secondary | ICD-10-CM | POA: Diagnosis present

## 2023-12-30 DIAGNOSIS — Z716 Tobacco abuse counseling: Secondary | ICD-10-CM

## 2023-12-30 DIAGNOSIS — K219 Gastro-esophageal reflux disease without esophagitis: Secondary | ICD-10-CM | POA: Diagnosis present

## 2023-12-30 DIAGNOSIS — B9562 Methicillin resistant Staphylococcus aureus infection as the cause of diseases classified elsewhere: Secondary | ICD-10-CM | POA: Diagnosis present

## 2023-12-30 DIAGNOSIS — R739 Hyperglycemia, unspecified: Secondary | ICD-10-CM | POA: Diagnosis present

## 2023-12-30 DIAGNOSIS — F17210 Nicotine dependence, cigarettes, uncomplicated: Secondary | ICD-10-CM | POA: Diagnosis present

## 2023-12-30 DIAGNOSIS — E876 Hypokalemia: Secondary | ICD-10-CM | POA: Diagnosis present

## 2023-12-30 DIAGNOSIS — K650 Generalized (acute) peritonitis: Secondary | ICD-10-CM | POA: Diagnosis present

## 2023-12-30 DIAGNOSIS — L02211 Cutaneous abscess of abdominal wall: Secondary | ICD-10-CM | POA: Diagnosis present

## 2023-12-30 DIAGNOSIS — E871 Hypo-osmolality and hyponatremia: Secondary | ICD-10-CM | POA: Diagnosis present

## 2023-12-30 LAB — CBC WITH DIFFERENTIAL/PLATELET
Abs Immature Granulocytes: 0.18 10*3/uL — ABNORMAL HIGH (ref 0.00–0.07)
Basophils Absolute: 0.1 10*3/uL (ref 0.0–0.1)
Basophils Relative: 0 %
Eosinophils Absolute: 0.2 10*3/uL (ref 0.0–0.5)
Eosinophils Relative: 1 %
HCT: 43.6 % (ref 39.0–52.0)
Hemoglobin: 14.6 g/dL (ref 13.0–17.0)
Immature Granulocytes: 1 %
Lymphocytes Relative: 14 %
Lymphs Abs: 3.1 10*3/uL (ref 0.7–4.0)
MCH: 30.9 pg (ref 26.0–34.0)
MCHC: 33.5 g/dL (ref 30.0–36.0)
MCV: 92.4 fL (ref 80.0–100.0)
Monocytes Absolute: 1.5 10*3/uL — ABNORMAL HIGH (ref 0.1–1.0)
Monocytes Relative: 7 %
Neutro Abs: 17.1 10*3/uL — ABNORMAL HIGH (ref 1.7–7.7)
Neutrophils Relative %: 77 %
Platelets: 179 10*3/uL (ref 150–400)
RBC: 4.72 MIL/uL (ref 4.22–5.81)
RDW: 13.3 % (ref 11.5–15.5)
WBC: 22.2 10*3/uL — ABNORMAL HIGH (ref 4.0–10.5)
nRBC: 0 % (ref 0.0–0.2)

## 2023-12-30 LAB — BASIC METABOLIC PANEL WITH GFR
Anion gap: 11 (ref 5–15)
BUN: 8 mg/dL (ref 6–20)
CO2: 25 mmol/L (ref 22–32)
Calcium: 8.9 mg/dL (ref 8.9–10.3)
Chloride: 94 mmol/L — ABNORMAL LOW (ref 98–111)
Creatinine, Ser: 0.71 mg/dL (ref 0.61–1.24)
GFR, Estimated: >60 mL/min (ref >60)
Glucose, Bld: 123 mg/dL — ABNORMAL HIGH (ref 70–99)
Potassium: 3.1 mmol/L — ABNORMAL LOW (ref 3.5–5.1)
Sodium: 130 mmol/L — ABNORMAL LOW (ref 135–145)

## 2023-12-30 LAB — LACTIC ACID, PLASMA: Lactic Acid, Venous: 1.2 mmol/L (ref 0.5–1.9)

## 2023-12-30 MED ORDER — ACETAMINOPHEN 325 MG PO TABS
650.0000 mg | ORAL_TABLET | Freq: Four times a day (QID) | ORAL | Status: DC | PRN
  Administered 2023-12-31 – 2024-01-01 (×5): 650 mg via ORAL
  Filled 2023-12-30 (×5): qty 2

## 2023-12-30 MED ORDER — POTASSIUM CHLORIDE CRYS ER 20 MEQ PO TBCR
40.0000 meq | EXTENDED_RELEASE_TABLET | Freq: Once | ORAL | Status: AC
  Administered 2023-12-30: 40 meq via ORAL
  Filled 2023-12-30: qty 2

## 2023-12-30 MED ORDER — ENOXAPARIN SODIUM 40 MG/0.4ML IJ SOSY
40.0000 mg | PREFILLED_SYRINGE | INTRAMUSCULAR | Status: DC
  Administered 2023-12-30 – 2024-01-03 (×5): 40 mg via SUBCUTANEOUS
  Filled 2023-12-30 (×5): qty 0.4

## 2023-12-30 MED ORDER — ACETAMINOPHEN 650 MG RE SUPP: 650.0000 mg | Freq: Four times a day (QID) | RECTAL | Status: DC | PRN

## 2023-12-30 MED ORDER — ONDANSETRON HCL 4 MG PO TABS: 4.0000 mg | ORAL_TABLET | Freq: Four times a day (QID) | ORAL | Status: DC | PRN

## 2023-12-30 MED ORDER — IOHEXOL 300 MG/ML  SOLN
100.0000 mL | Freq: Once | INTRAMUSCULAR | Status: AC | PRN
Start: 2023-12-30 — End: 2023-12-30
  Administered 2023-12-30: 100 mL via INTRAVENOUS

## 2023-12-30 MED ORDER — NICOTINE 21 MG/24HR TD PT24
21.0000 mg | MEDICATED_PATCH | Freq: Every day | TRANSDERMAL | Status: DC
  Administered 2023-12-30 – 2024-01-03 (×5): 21 mg via TRANSDERMAL
  Filled 2023-12-30 (×5): qty 1

## 2023-12-30 MED ORDER — VANCOMYCIN HCL 1750 MG/350ML IV SOLN
1750.0000 mg | Freq: Once | INTRAVENOUS | Status: AC
  Administered 2023-12-30: 1750 mg via INTRAVENOUS
  Filled 2023-12-30: qty 350

## 2023-12-30 MED ORDER — CEFAZOLIN SODIUM-DEXTROSE 2-4 GM/100ML-% IV SOLN
2.0000 g | Freq: Three times a day (TID) | INTRAVENOUS | Status: AC
  Administered 2023-12-30 – 2023-12-31 (×3): 2 g via INTRAVENOUS
  Filled 2023-12-30 (×3): qty 100

## 2023-12-30 MED ORDER — POTASSIUM CHLORIDE IN NACL 40-0.9 MEQ/L-% IV SOLN: INTRAVENOUS | Status: AC

## 2023-12-30 MED ORDER — KETOROLAC TROMETHAMINE 15 MG/ML IJ SOLN
15.0000 mg | Freq: Once | INTRAMUSCULAR | Status: AC
  Administered 2023-12-30: 15 mg via INTRAVENOUS
  Filled 2023-12-30: qty 1

## 2023-12-30 MED ORDER — ONDANSETRON HCL 4 MG/2ML IJ SOLN
4.0000 mg | Freq: Four times a day (QID) | INTRAMUSCULAR | Status: DC | PRN
  Administered 2023-12-31: 4 mg via INTRAVENOUS
  Filled 2023-12-30: qty 2

## 2023-12-30 NOTE — ED Triage Notes (Signed)
 Pt arrived in POV with c/o abscess to right lower abd. Noted redness, swelling, warmth, and pt confirmed pain. Pt stated it started Thursday.

## 2023-12-30 NOTE — H&P (Signed)
 History and Physical    Patient: Glen West ZOX:[id number] DOB: 18-Jul-1981 DOA: 12/30/2023 DOS: the patient was seen and examined on 12/30/2023 PCP: Patient, No Pcp Per  Patient coming from: Home  Chief Complaint:  Chief Complaint  Patient presents with   Abscess   HPI: Glen West is a 42 y.o. male with medical history significant of no medical problems.  He reports a pimple that started on Thursday.  He squeezed it and got some pus out.  Since then, it has become more red and tender.  It is acutely worse over the last 12 hours.  Due to the increase in size, he presents to the hospital for evaluation.  Denies fevers, chills, nausea, vomiting, abdominal pain, decreased appetite.  Review of Systems: As mentioned in the history of present illness. All other systems reviewed and are negative. History reviewed. No pertinent past medical history. Past Surgical History:  Procedure Laterality Date   left arm surgery     TONSILLECTOMY     Social History:  reports that he has been smoking cigarettes. He has never used smokeless tobacco. He reports current alcohol use. He reports that he does not currently use drugs after having used the following drugs: Marijuana.  No Known Allergies  History reviewed. No pertinent family history.  Prior to Admission medications   Not on File    Physical Exam: Vitals:   12/30/23 1210 12/30/23 1212 12/30/23 1754  BP: 100/64  118/69  Pulse: 87  85  Resp: 18  16  Temp: 98.2 F (36.8 C)  99.1 F (37.3 C)  TempSrc: Oral  Oral  SpO2: 97%  97%  Weight:  81.6 kg   Height:  6\' 2"  (1.88 m)    General: Middle-age male. Awake and alert and oriented x3. No acute cardiopulmonary distress.  HEENT: Normocephalic atraumatic.  Right and left ears normal in appearance.  Pupils equal, round, reactive to light. Extraocular muscles are intact. Sclerae anicteric and noninjected.  Moist mucosal membranes. No mucosal lesions.  Neck: Neck supple without  lymphadenopathy. No carotid bruits. No masses palpated.  Cardiovascular: Regular rate with normal S1-S2 sounds. No murmurs, rubs, gallops auscultated. No JVD.  Respiratory: Good respiratory effort with no wheezes, rales, rhonchi. Lungs clear to auscultation bilaterally.  No accessory muscle use. Abdomen: Soft, nontender, nondistended. Active bowel sounds. No masses or hepatosplenomegaly  Skin: Extensive cellulitis on the right lower abdominal wall.  See picture in media for details.  Dry, warm to touch. 2+ dorsalis pedis and radial pulses. Musculoskeletal: No calf or leg pain. All major joints not erythematous nontender.  No upper or lower joint deformation.  Good ROM.  No contractures  Psychiatric: Intact judgment and insight. Pleasant and cooperative. Neurologic: No focal neurological deficits. Strength is 5/5 and symmetric in upper and lower extremities.  Cranial nerves II through XII are grossly intact.  Data Reviewed: Results for orders placed or performed during the hospital encounter of 12/30/23 (from the past 24 hours)  CBC with Differential     Status: Abnormal   Collection Time: 12/30/23  3:29 PM  Result Value Ref Range   WBC 22.2 (H) 4.0 - 10.5 K/uL   RBC 4.72 4.22 - 5.81 MIL/uL   Hemoglobin 14.6 13.0 - 17.0 g/dL   HCT 81.1 91.4 - 78.2 %   MCV 92.4 80.0 - 100.0 fL   MCH 30.9 26.0 - 34.0 pg   MCHC 33.5 30.0 - 36.0 g/dL   RDW 95.6 21.3 - 08.6 %  Platelets 179 150 - 400 K/uL   nRBC 0.0 0.0 - 0.2 %   Neutrophils Relative % 77 %   Neutro Abs 17.1 (H) 1.7 - 7.7 K/uL   Lymphocytes Relative 14 %   Lymphs Abs 3.1 0.7 - 4.0 K/uL   Monocytes Relative 7 %   Monocytes Absolute 1.5 (H) 0.1 - 1.0 K/uL   Eosinophils Relative 1 %   Eosinophils Absolute 0.2 0.0 - 0.5 K/uL   Basophils Relative 0 %   Basophils Absolute 0.1 0.0 - 0.1 K/uL   Immature Granulocytes 1 %   Abs Immature Granulocytes 0.18 (H) 0.00 - 0.07 K/uL  Basic metabolic panel     Status: Abnormal   Collection Time:  12/30/23  3:29 PM  Result Value Ref Range   Sodium 130 (L) 135 - 145 mmol/L   Potassium 3.1 (L) 3.5 - 5.1 mmol/L   Chloride 94 (L) 98 - 111 mmol/L   CO2 25 22 - 32 mmol/L   Glucose, Bld 123 (H) 70 - 99 mg/dL   BUN 8 6 - 20 mg/dL   Creatinine, Ser 4.09 0.61 - 1.24 mg/dL   Calcium  8.9 8.9 - 10.3 mg/dL   GFR, Estimated >81 >19 mL/min   Anion gap 11 5 - 15  Lactic acid, plasma     Status: None   Collection Time: 12/30/23  6:03 PM  Result Value Ref Range   Lactic Acid, Venous 1.2 0.5 - 1.9 mmol/L    CT ABDOMEN PELVIS W CONTRAST Result Date: 12/30/2023 CLINICAL DATA:  Abdominal wall abscess, pain EXAM: CT ABDOMEN AND PELVIS WITH CONTRAST TECHNIQUE: Multidetector CT imaging of the abdomen and pelvis was performed using the standard protocol following bolus administration of intravenous contrast. RADIATION DOSE REDUCTION: This exam was performed according to the departmental dose-optimization program which includes automated exposure control, adjustment of the mA and/or kV according to patient size and/or use of iterative reconstruction technique. CONTRAST:  OMNIPAQUE  IOHEXOL  300 MG/ML  SOLN COMPARISON:  08/17/2020 FINDINGS: Lower chest: No pleural or pericardial effusion. Visualized lung bases clear. Hepatobiliary: No focal liver abnormality is seen. No gallstones, gallbladder wall thickening, or biliary dilatation. Pancreas: Unremarkable. No pancreatic ductal dilatation or surrounding inflammatory changes. Spleen: Normal in size without focal abnormality. Adrenals/Urinary Tract: Adrenal glands are unremarkable. Kidneys are normal, without renal calculi, focal lesion, or hydronephrosis. Bladder is unremarkable. Stomach/Bowel: Stomach partially distended, without acute finding. Small bowel decompressed. Normal appendix. The colon is partially distended, without acute finding. Vascular/Lymphatic: No AAA. Portal vein patent. Subcentimeter aortocaval and left periaortic lymph nodes. Enhancing enlarged  right inguinal and external iliac nodes up to 1.4 cm. Reproductive: Prostate is unremarkable. Other: No abdominal ascites.  No free air. Musculoskeletal: Marked subcutaneous edematous/inflammatory change in the anterior body wall overlying the right pelvis. There is some linear fluid extending through the deep subcutaneous tissues but no well-defined abscess pocket. There is overlying skin thickening. IMPRESSION: 1. Marked right pelvic body wall cellulitis without drainable abscess. 2. Regional inguinal and external iliac adenopathy likely reactive. Electronically Signed   By: Nicoletta Barrier M.D.   On: 12/30/2023 18:03     Assessment and Plan: No notes have been filed under this hospital service. Service: Hospitalist  Principal Problem:   Abdominal wall cellulitis Active Problems:   Hypokalemia   Hyperglycemia  Abdominal wall cellulitis Vancomycin .  Will Co. treat with Ancef .  Will swab for MRSA.  If MRSA negative, will discontinue vancomycin . Wound culture and blood culture pending. Hyperglycemia Check hemoglobin A1c  Hypokalemia Potassium replacement.  Recheck potassium in the morning   Advance Care Planning:   Code Status: Full Code confirmed by patient  Consults: None  Family Communication: None  Severity of Illness: The appropriate patient status for this patient is INPATIENT. Inpatient status is judged to be reasonable and necessary in order to provide the required intensity of service to ensure the patient's safety. The patient's presenting symptoms, physical exam findings, and initial radiographic and laboratory data in the context of their chronic comorbidities is felt to place them at high risk for further clinical deterioration. Furthermore, it is not anticipated that the patient will be medically stable for discharge from the hospital within 2 midnights of admission.   * I certify that at the point of admission it is my clinical judgment that the patient will require inpatient  hospital care spanning beyond 2 midnights from the point of admission due to high intensity of service, high risk for further deterioration and high frequency of surveillance required.*  Author: Abreanna Drawdy J Shelisha Gautier, DO 12/30/2023 7:59 PM  For on call review [email or web address].

## 2023-12-30 NOTE — Consult Note (Signed)
 Pharmacy Antibiotic Note  Glen West is a 43 y.o. male admitted on 12/30/2023 with abscess and cellulitis of right lower abdomen.  Pharmacy has been consulted for vancomycin  dosing.  WBC 22.2, afebrile  Plan: Start vancomycin  1750 mg IV every 12 hours Estimated AUC 493.1, Cmin 11.6 81.6 kg, Scr rounded to 0.8, Vd 0.72 Vancomycin  levels at steady state or as clinically indicated Follow renal function and cultures for adjustments  Height: 6\' 2"  (188 cm) Weight: 81.6 kg (180 lb) IBW/kg (Calculated) : 82.2  Temp (24hrs), Avg:98.2 F (36.8 C), Min:98.2 F (36.8 C), Max:98.2 F (36.8 C)  Recent Labs  Lab 12/30/23 1529  WBC 22.2*  CREATININE 0.71    Estimated Creatinine Clearance: 140.3 mL/min (by C-G formula based on SCr of 0.71 mg/dL).    No Known Allergies  Antimicrobials this admission: Vancomycin  4/20 >>  Microbiology results: 4/20 BCx: pending 4/20 superficial specimen WCx: pending   Thank you for allowing pharmacy to be a part of this patient's care.  Ramonita Burow, PharmD 12/30/2023 5:46 PM

## 2023-12-30 NOTE — ED Provider Notes (Signed)
 [place] EMERGENCY DEPARTMENT AT [facility] Provider Note   CSN: [id number] Arrival date & time: 12/30/23  1206     History  Chief Complaint  Patient presents with   Abscess    JESSIAH STEINHART is a 43 y.o. male.  Denies significant past medical history presents to ER with redness and abscess to right lower abdomen.  Started 3 to 4 days ago and has gotten worse.  Denies fevers or chills.  He has had drainage from the area.  He states he came appear to get antibiotics because it was not getting better.  He has localized tenderness denies abdominal pain, nausea or vomiting otherwise.  Denies urinary symptoms.   Abscess      Home Medications Prior to Admission medications   Not on File      Allergies    Patient has no known allergies.    Review of Systems   Review of Systems  Physical Exam Updated Vital Signs BP 118/69   Pulse 85   Temp 99.1 F (37.3 C) (Oral)   Resp 16   Ht 6\' 2"  (1.88 m)   Wt 81.6 kg   SpO2 97%   BMI 23.11 kg/m  Physical Exam Vitals and nursing note reviewed.  Constitutional:      General: He is not in acute distress.    Appearance: He is well-developed.  HENT:     Head: Normocephalic and atraumatic.  Eyes:     Conjunctiva/sclera: Conjunctivae normal.  Cardiovascular:     Rate and Rhythm: Normal rate and regular rhythm.     Heart sounds: No murmur heard. Pulmonary:     Effort: Pulmonary effort is normal. No respiratory distress.     Breath sounds: Normal breath sounds.  Abdominal:     Palpations: Abdomen is soft.     Tenderness: There is no abdominal tenderness.  Musculoskeletal:        General: No swelling.     Cervical back: Neck supple.  Skin:    General: Skin is warm and dry.     Capillary Refill: Capillary refill takes less than 2 seconds.     Comments: Approximately 16 cm x 13 cm area of erythema with approximate 3 cm area of induration at the right lower portion of this with minimal purulent drainage, patient is  persistently pressing and expressing purulence from the wound  Neurological:     General: No focal deficit present.     Mental Status: He is alert and oriented to person, place, and time.  Psychiatric:        Mood and Affect: Mood normal.     ED Results / Procedures / Treatments   Labs (all labs ordered are listed, but only abnormal results are displayed) Labs Reviewed  CBC WITH DIFFERENTIAL/PLATELET - Abnormal; Notable for the following components:      Result Value   WBC 22.2 (*)    Neutro Abs 17.1 (*)    Monocytes Absolute 1.5 (*)    Abs Immature Granulocytes 0.18 (*)    All other components within normal limits  BASIC METABOLIC PANEL WITH GFR - Abnormal; Notable for the following components:   Sodium 130 (*)    Potassium 3.1 (*)    Chloride 94 (*)    Glucose, Bld 123 (*)    All other components within normal limits  CULTURE, BLOOD (ROUTINE X 2)  CULTURE, BLOOD (ROUTINE X 2)  AEROBIC CULTURE W GRAM STAIN (SUPERFICIAL SPECIMEN)  MRSA NEXT GEN BY  PCR, NASAL  LACTIC ACID, PLASMA    EKG None  Radiology CT ABDOMEN PELVIS W CONTRAST Result Date: 12/30/2023 CLINICAL DATA:  Abdominal wall abscess, pain EXAM: CT ABDOMEN AND PELVIS WITH CONTRAST TECHNIQUE: Multidetector CT imaging of the abdomen and pelvis was performed using the standard protocol following bolus administration of intravenous contrast. RADIATION DOSE REDUCTION: This exam was performed according to the departmental dose-optimization program which includes automated exposure control, adjustment of the mA and/or kV according to patient size and/or use of iterative reconstruction technique. CONTRAST:  OMNIPAQUE  IOHEXOL  300 MG/ML  SOLN COMPARISON:  08/17/2020 FINDINGS: Lower chest: No pleural or pericardial effusion. Visualized lung bases clear. Hepatobiliary: No focal liver abnormality is seen. No gallstones, gallbladder wall thickening, or biliary dilatation. Pancreas: Unremarkable. No pancreatic ductal dilatation  or surrounding inflammatory changes. Spleen: Normal in size without focal abnormality. Adrenals/Urinary Tract: Adrenal glands are unremarkable. Kidneys are normal, without renal calculi, focal lesion, or hydronephrosis. Bladder is unremarkable. Stomach/Bowel: Stomach partially distended, without acute finding. Small bowel decompressed. Normal appendix. The colon is partially distended, without acute finding. Vascular/Lymphatic: No AAA. Portal vein patent. Subcentimeter aortocaval and left periaortic lymph nodes. Enhancing enlarged right inguinal and external iliac nodes up to 1.4 cm. Reproductive: Prostate is unremarkable. Other: No abdominal ascites.  No free air. Musculoskeletal: Marked subcutaneous edematous/inflammatory change in the anterior body wall overlying the right pelvis. There is some linear fluid extending through the deep subcutaneous tissues but no well-defined abscess pocket. There is overlying skin thickening. IMPRESSION: 1. Marked right pelvic body wall cellulitis without drainable abscess. 2. Regional inguinal and external iliac adenopathy likely reactive. Electronically Signed   By: Nicoletta Barrier M.D.   On: 12/30/2023 18:03    Procedures Procedures    Medications Ordered in ED Medications  vancomycin  (VANCOREADY) IVPB 1750 mg/350 mL (1,750 mg Intravenous New Bag/Given 12/30/23 1809)  ceFAZolin  (ANCEF ) IVPB 2g/100 mL premix (has no administration in time range)  iohexol  (OMNIPAQUE ) 300 MG/ML solution 100 mL (100 mLs Intravenous Contrast Given 12/30/23 1720)  ketorolac  (TORADOL ) 15 MG/ML injection 15 mg (15 mg Intravenous Given 12/30/23 1758)  potassium chloride  SA (KLOR-CON  M) CR tablet 40 mEq (40 mEq Oral Given 12/30/23 1757)    ED Course/ Medical Decision Making/ A&P                                 Medical Decision Making This patient presents to the ED for concern of infection of abdominal wall, this involves an extensive number of treatment options, and is a complaint that  carries with it a high risk of complications and morbidity.  The differential diagnosis includes abscess, cellulitis, sepsis, epidermal inclusion cyst, other   Co morbidities that complicate the patient evaluation :   none   Additional history obtained:  Additional history obtained from EMR External records from outside source obtained and reviewed including prior notes   Lab Tests:  I Ordered, and personally interpreted labs.  The pertinent results include: Blood cell count 22.2, lactic acid normal, sodium 130, potassium 3.1   Imaging Studies ordered:  I ordered imaging studies including CT abdomen pelvis which shows abdominal wall cellulitis with no drainable collection I independently visualized and interpreted imaging within scope of identifying emergent findings  I agree with the radiologist interpretation    Consultations Obtained:  I requested consultation with the hospitalist, Dr. Cathyann Cobia,  and discussed lab and imaging findings as well as pertinent plan -  they recommend: Admission, will add Ancef  to vancomycin  already given pending cultures   Problem List / ED Course / Critical interventions / Medication management  Abdominal wall cellulitis having several days of increasing redness to abdominal wall, having drainage from small punctate in the right suprapubic area.  There is no crepitus, he does not meet sepsis criteria, CT does not show any drainable collection, will admit for IV antibiotics. I ordered medication including Toradol  for pain Reevaluation of the patient after these medicines showed that the patient improved I have reviewed the patients home medicines and have made adjustments as needed      Amount and/or Complexity of Data Reviewed Labs: ordered. Radiology: ordered.  Risk Prescription drug management. Decision regarding hospitalization.           Final Clinical Impression(s) / ED Diagnoses Final diagnoses:  Cellulitis of abdominal  wall    Rx / DC Orders ED Discharge Orders     None         Joshua Nieves 12/30/23 1836    Early Glisson, MD 12/31/23 1547

## 2023-12-31 DIAGNOSIS — Z720 Tobacco use: Secondary | ICD-10-CM | POA: Diagnosis present

## 2023-12-31 LAB — CBC
HCT: 39.4 % (ref 39.0–52.0)
Hemoglobin: 13.2 g/dL (ref 13.0–17.0)
MCH: 30.6 pg (ref 26.0–34.0)
MCHC: 33.5 g/dL (ref 30.0–36.0)
MCV: 91.2 fL (ref 80.0–100.0)
Platelets: 159 10*3/uL (ref 150–400)
RBC: 4.32 MIL/uL (ref 4.22–5.81)
RDW: 13.2 % (ref 11.5–15.5)
WBC: 19.8 10*3/uL — ABNORMAL HIGH (ref 4.0–10.5)
nRBC: 0 % (ref 0.0–0.2)

## 2023-12-31 LAB — BASIC METABOLIC PANEL WITH GFR
Anion gap: 8 (ref 5–15)
BUN: 8 mg/dL (ref 6–20)
CO2: 23 mmol/L (ref 22–32)
Calcium: 8.5 mg/dL — ABNORMAL LOW (ref 8.9–10.3)
Chloride: 102 mmol/L (ref 98–111)
Creatinine, Ser: 0.64 mg/dL (ref 0.61–1.24)
GFR, Estimated: >60 mL/min (ref >60)
Glucose, Bld: 109 mg/dL — ABNORMAL HIGH (ref 70–99)
Potassium: 4 mmol/L (ref 3.5–5.1)
Sodium: 133 mmol/L — ABNORMAL LOW (ref 135–145)

## 2023-12-31 LAB — HEMOGLOBIN A1C
Hgb A1c MFr Bld: 4.5 % — ABNORMAL LOW (ref 4.8–5.6)
Mean Plasma Glucose: 82.45 mg/dL

## 2023-12-31 LAB — MRSA NEXT GEN BY PCR, NASAL: MRSA by PCR Next Gen: DETECTED — AB

## 2023-12-31 LAB — HIV ANTIBODY (ROUTINE TESTING W REFLEX): HIV Screen 4th Generation wRfx: NONREACTIVE

## 2023-12-31 MED ORDER — FAMOTIDINE 20 MG PO TABS
20.0000 mg | ORAL_TABLET | Freq: Two times a day (BID) | ORAL | Status: DC
  Administered 2023-12-31 – 2024-01-04 (×8): 20 mg via ORAL
  Filled 2023-12-31 (×8): qty 1

## 2023-12-31 MED ORDER — VANCOMYCIN HCL 1500 MG/300ML IV SOLN
1500.0000 mg | Freq: Two times a day (BID) | INTRAVENOUS | Status: DC
  Administered 2023-12-31 – 2024-01-04 (×8): 1500 mg via INTRAVENOUS
  Filled 2023-12-31 (×8): qty 300

## 2023-12-31 MED ORDER — SODIUM CHLORIDE 0.9 % IV SOLN: INTRAVENOUS | Status: AC

## 2023-12-31 MED ORDER — CALCIUM CARBONATE ANTACID 500 MG PO CHEW
400.0000 mg | CHEWABLE_TABLET | Freq: Once | ORAL | Status: AC
  Administered 2023-12-31: 400 mg via ORAL
  Filled 2023-12-31: qty 2

## 2023-12-31 NOTE — Progress Notes (Addendum)
 PROGRESS NOTE  [place], is a 43 y.o. male, DOB - Mar 08, 1981, WUJ:[id number]  Admit date - 12/30/2023   Admitting Physician No admitting provider for patient encounter.  Outpatient Primary MD for the patient is Patient, No Pcp Per  LOS - 1  Chief Complaint  Patient presents with   Abscess      Brief Narrative:  - 43 year old male with past medical history notable for tobacco abuse and GERD admitted on 12/30/2023 with right lower abdominal anterior wall cellulitis   -Assessment and Plan: 1)Right anterior lower abdominal wall cellulitis---  Nasal swab for MRSA positive T max 101.T current 99.5 WBC 22.2 >>19.8 On 12/31/23---I was able to clean the abdominal wound and express purulent contents and send it for culture and Gram stain - Currently on IV Ancef  - Changed to IV vancomycin  on 12/31/23 - 2)HypoNatremia---- Suspect dehydration related - sodium up to 133 from 130 with IV fluids - Continue IV fluids  3)Hypokalemia--- normalized after replacement  4)Tobacco Abuse-- Smoking cessation counseling for 4 minutes today,  Give nicotine  patch I have discussed tobacco cessation with the patient.  I have counseled the patient regarding the negative impacts of continued tobacco use including but not limited to lung cancer, COPD, and cardiovascular disease.  I have discussed alternatives to tobacco and modalities that may help facilitate tobacco cessation including but not limited to biofeedback, hypnosis, and medications.  Total time spent with tobacco counseling was 4 minutes.  5)Hyperglycemia--due to infection and stress - A1c 4.5, no evidence of diabetes  6)GERD--give Pepcid  and Tums  Status is: Inpatient   Disposition: The patient is from: Home              Anticipated d/c is to: Home              Anticipated d/c date is: 2 days              Patient currently is not medically stable to d/c. Barriers: Not Clinically Stable-   Code Status :  -  Code Status: Full Code    Family Communication:    NA (patient is alert, awake and coherent)   DVT Prophylaxis  :   - SCDs  enoxaparin  (LOVENOX ) injection 40 mg Start: 12/30/23 2200   Lab Results  Component Value Date   PLT 159 12/31/2023    Inpatient Medications  Scheduled Meds:  enoxaparin  (LOVENOX ) injection  40 mg Subcutaneous Q24H   nicotine   21 mg Transdermal Daily   Continuous Infusions:  sodium chloride       ceFAZolin  (ANCEF ) IV 2 g (12/31/23 0607)   vancomycin  1,500 mg (12/31/23 1349)   PRN Meds:.acetaminophen  **OR** acetaminophen , ondansetron  **OR** ondansetron  (ZOFRAN ) IV  Anti-infectives (From admission, onward)    Start     Dose/Rate Route Frequency Ordered Stop   12/31/23 1400  vancomycin  (VANCOREADY) IVPB 1500 mg/300 mL        1,500 mg 150 mL/hr over 120 Minutes Intravenous Every 12 hours 12/31/23 1257     12/30/23 2200  ceFAZolin  (ANCEF ) IVPB 2g/100 mL premix        2 g 200 mL/hr over 30 Minutes Intravenous Every 8 hours 12/30/23 1831 12/31/23 1900   12/30/23 1800  vancomycin  (VANCOREADY) IVPB 1750 mg/350 mL        1,750 mg 175 mL/hr over 120 Minutes Intravenous  Once 12/30/23 1745 12/30/23 2009      Subjective: [facility] today has  no emesis,  No chest pain,  T max 101.T current  99.5  Objective: Vitals:   12/31/23 0459 12/31/23 0803 12/31/23 1105 12/31/23 1316  BP: 112/62 121/75 121/76 118/77  Pulse: 86 93 89 81  Resp: 16 16 17 16   Temp: 99.7 F (37.6 C) (!) 101.7 F (38.7 C) 98.2 F (36.8 C) 99.5 F (37.5 C)  TempSrc: Oral Oral Oral Oral  SpO2: 98% 97% 98% 100%  Weight:      Height:        Intake/Output Summary (Last 24 hours) at 12/31/2023 1702 Last data filed at 12/31/2023 1312 Gross per 24 hour  Intake 840 ml  Output --  Net 840 ml   Filed Weights   12/30/23 1212 12/30/23 2018  Weight: 81.6 kg 76.2 kg    Physical Exam Gen:- Awake Alert,  in no apparent distress  HEENT:- [place].AT, No sclera icterus, poor dentition Neck-Supple Neck,No JVD,.   Lungs-  CTAB , fair symmetrical air movement CV- S1, S2 normal, regular  Abd-  +ve B.Sounds, Abd Soft, No tenderness,    Extremity:- No  edema, pedal pulses present  Psych-affect is appropriate, oriented x3 Neuro-no new focal deficits, no tremors Skin--Rt Lower Anterior wall and Upper Thigh area with erythema, warmt, tenderness --area of induration with purulent drainage noted ....  See Photo below--   Media Information  Document Information  Photos  Rt Lower abd and Rt Upper Thigh Cellulitis  12/31/2023 14:39  Attached To:  Hospital Encounter on 12/30/23  Source Information  Colin Dawley, MD  [facility]  Document History      Media Information  Document Information  Photos  Rt Lower Abd and Rt upper Thigh cellulitis  12/31/2023 14:38  Attached To:  Hospital Encounter on 12/30/23  Source Information  Colin Dawley, MD  [facility]  Document History    Data Reviewed: I have personally reviewed following labs and imaging studies  CBC: Recent Labs  Lab 12/30/23 1529 12/31/23 0435  WBC 22.2* 19.8*  NEUTROABS 17.1*  --   HGB 14.6 13.2  HCT 43.6 39.4  MCV 92.4 91.2  PLT 179 159   Basic Metabolic Panel: Recent Labs  Lab 12/30/23 1529 12/31/23 0435  NA 130* 133*  K 3.1* 4.0  CL 94* 102  CO2 25 23  GLUCOSE 123* 109*  BUN 8 8  CREATININE 0.71 0.64  CALCIUM  8.9 8.5*   GFR: Estimated Creatinine Clearance: 131 mL/min (by C-G formula based on SCr of 0.64 mg/dL).  HbA1C: Recent Labs    12/31/23 0435  HGBA1C 4.5*   Recent Results (from the past 240 hours)  Blood culture (routine x 2)     Status: None (Preliminary result)   Collection Time: 12/30/23  3:35 PM   Specimen: BLOOD  Result Value Ref Range Status   Specimen Description BLOOD  Final   Special Requests NONE  Final   Culture   Final    NO GROWTH < 24 HOURS Performed at [facility], [address], [place], [place] [address]    Report Status PENDING  Incomplete  Aerobic Culture w  Gram Stain (superficial specimen)     Status: None (Preliminary result)   Collection Time: 12/30/23  3:35 PM   Specimen: Skin, Other  Result Value Ref Range Status   Specimen Description   Final    ABDOMEN Performed at [facility], [address] [place], [place] [address]    Special Requests   Final    NONE Performed at [facility], [address], [place], [place] [address]  Gram Stain   Final    RARE WBC PRESENT, PREDOMINANTLY PMN RARE GRAM POSITIVE COCCI    Culture   Final    TOO YOUNG TO READ Performed at [facility], [address], [place], [place] [address]    Report Status PENDING  Incomplete  Blood culture (routine x 2)     Status: None (Preliminary result)   Collection Time: 12/30/23  3:40 PM   Specimen: BLOOD  Result Value Ref Range Status   Specimen Description BLOOD  Final   Special Requests NONE  Final   Culture   Final    NO GROWTH < 24 HOURS Performed at [facility], [address], [place], [place] [address]    Report Status PENDING  Incomplete  MRSA Next Gen by PCR, Nasal     Status: Abnormal   Collection Time: 12/31/23  1:41 PM   Specimen: Nasal Mucosa; Nasal Swab  Result Value Ref Range Status   MRSA by PCR Next Gen DETECTED (A) NOT DETECTED Final    Comment: RESULT CALLED TO, READ BACK BY AND VERIFIED WITH: B GALLOWAY AT 1604 12/31/23 BY A WILSON (NOTE) The GeneXpert MRSA Assay (FDA approved for NASAL specimens only), is one component of a comprehensive MRSA colonization surveillance program. It is not intended to diagnose MRSA infection nor to guide or monitor treatment for MRSA infections. Test performance is not FDA approved in patients less than 27 years old. Performed at [facility], [address], [place], [place] [address]     Radiology Studies: CT ABDOMEN PELVIS W CONTRAST Result Date: 12/30/2023 CLINICAL DATA:  Abdominal wall abscess, pain EXAM: CT ABDOMEN AND PELVIS WITH CONTRAST TECHNIQUE: Multidetector CT imaging of the  abdomen and pelvis was performed using the standard protocol following bolus administration of intravenous contrast. RADIATION DOSE REDUCTION: This exam was performed according to the departmental dose-optimization program which includes automated exposure control, adjustment of the mA and/or kV according to patient size and/or use of iterative reconstruction technique. CONTRAST:  OMNIPAQUE  IOHEXOL  300 MG/ML  SOLN COMPARISON:  08/17/2020 FINDINGS: Lower chest: No pleural or pericardial effusion. Visualized lung bases clear. Hepatobiliary: No focal liver abnormality is seen. No gallstones, gallbladder wall thickening, or biliary dilatation. Pancreas: Unremarkable. No pancreatic ductal dilatation or surrounding inflammatory changes. Spleen: Normal in size without focal abnormality. Adrenals/Urinary Tract: Adrenal glands are unremarkable. Kidneys are normal, without renal calculi, focal lesion, or hydronephrosis. Bladder is unremarkable. Stomach/Bowel: Stomach partially distended, without acute finding. Small bowel decompressed. Normal appendix. The colon is partially distended, without acute finding. Vascular/Lymphatic: No AAA. Portal vein patent. Subcentimeter aortocaval and left periaortic lymph nodes. Enhancing enlarged right inguinal and external iliac nodes up to 1.4 cm. Reproductive: Prostate is unremarkable. Other: No abdominal ascites.  No free air. Musculoskeletal: Marked subcutaneous edematous/inflammatory change in the anterior body wall overlying the right pelvis. There is some linear fluid extending through the deep subcutaneous tissues but no well-defined abscess pocket. There is overlying skin thickening. IMPRESSION: 1. Marked right pelvic body wall cellulitis without drainable abscess. 2. Regional inguinal and external iliac adenopathy likely reactive. Electronically Signed   By: Nicoletta Barrier M.D.   On: 12/30/2023 18:03   Scheduled Meds:  enoxaparin  (LOVENOX ) injection  40 mg Subcutaneous Q24H    nicotine   21 mg Transdermal Daily   Continuous Infusions:  sodium chloride       ceFAZolin  (ANCEF ) IV 2 g (12/31/23 0607)   vancomycin  1,500 mg (12/31/23 1349)    LOS: 1 day  Colin Dawley M.D on 12/31/2023 at 5:02 PM  Go to [email or web address] - for contact info  [facility] - Office  [phone]  If 7PM-7AM, please contact night-coverage [email or web address] 12/31/2023, 5:02 PM

## 2023-12-31 NOTE — Discharge Instructions (Signed)

## 2023-12-31 NOTE — Plan of Care (Signed)
   Problem: Education: Goal: Knowledge of General Education information will improve Description: Including pain rating scale, medication(s)/side effects and non-pharmacologic comfort measures Outcome: Progressing   Problem: Activity: Goal: Risk for activity intolerance will decrease Outcome: Progressing   Problem: Nutrition: Goal: Adequate nutrition will be maintained Outcome: Progressing   Problem: Coping: Goal: Level of anxiety will decrease Outcome: Progressing

## 2023-12-31 NOTE — Progress Notes (Signed)
   12/31/23 1244  TOC Brief Assessment  Insurance and Status Reviewed  Patient has primary care physician Yes  Home environment has been reviewed Home  Prior level of function: Independent  Prior/Current Home Services No current home services  Social Drivers of Health Review SDOH reviewed no interventions necessary  Readmission risk has been reviewed Yes  Transition of care needs no transition of care needs at this time      Transition of Care Department [facility]) has reviewed patient and no TOC needs have been identified at this time. We will continue to monitor patient advancement through interdisciplinary progression rounds. If new patient transition needs arise, please place a TOC consult.

## 2024-01-01 LAB — BASIC METABOLIC PANEL WITH GFR
Anion gap: 9 (ref 5–15)
BUN: 7 mg/dL (ref 6–20)
CO2: 24 mmol/L (ref 22–32)
Calcium: 8.3 mg/dL — ABNORMAL LOW (ref 8.9–10.3)
Chloride: 101 mmol/L (ref 98–111)
Creatinine, Ser: 0.58 mg/dL — ABNORMAL LOW (ref 0.61–1.24)
GFR, Estimated: >60 mL/min (ref >60)
Glucose, Bld: 125 mg/dL — ABNORMAL HIGH (ref 70–99)
Potassium: 3.6 mmol/L (ref 3.5–5.1)
Sodium: 134 mmol/L — ABNORMAL LOW (ref 135–145)

## 2024-01-01 LAB — CBC
HCT: 38.5 % — ABNORMAL LOW (ref 39.0–52.0)
Hemoglobin: 12.3 g/dL — ABNORMAL LOW (ref 13.0–17.0)
MCH: 30.1 pg (ref 26.0–34.0)
MCHC: 31.9 g/dL (ref 30.0–36.0)
MCV: 94.1 fL (ref 80.0–100.0)
Platelets: 133 10*3/uL — ABNORMAL LOW (ref 150–400)
RBC: 4.09 MIL/uL — ABNORMAL LOW (ref 4.22–5.81)
RDW: 13.2 % (ref 11.5–15.5)
WBC: 14.6 10*3/uL — ABNORMAL HIGH (ref 4.0–10.5)
nRBC: 0 % (ref 0.0–0.2)

## 2024-01-01 NOTE — Plan of Care (Signed)

## 2024-01-01 NOTE — Progress Notes (Signed)
 PROGRESS NOTE  Brick Ketcher, is a 43 y.o. male, DOB - 1981/07/11, EAV:[id number]  Admit date - 12/30/2023   Admitting Physician No admitting provider for patient encounter.  Outpatient Primary MD for the patient is Patient, No Pcp Per  LOS - 2  Chief Complaint  Patient presents with   Abscess      Brief Narrative:  - 43 year old male with past medical history notable for tobacco abuse and GERD admitted on 12/30/2023 with right lower abdominal anterior wall and pelvic area cellulitis   -Assessment and Plan: 1)Right anterior lower abdominal wall and pelvic cellulitis---  Nasal swab for MRSA positive CT AP shows Marked right pelvic body wall cellulitis without drainable abscess--reactive inguinal and external iliac adenopathy noted -T max 101.7, T current 99 WBC 22.2 >>19.8>>14.6 On 12/31/23---I was able to clean the abdominal wound and express purulent contents and send it for culture and Gram stain -Gram stain with GPC wound culture pending - C/n  IV vancomycin  (started on 12/31/23) - 2)HypoNatremia---- Suspect dehydration related - sodium up to 134 from 130 with IV fluids - Continue IV fluids  3)Hypokalemia--- normalized after replacement  4)Tobacco Abuse--smoking cessation advised C/n nicotine  patch  5)Hyperglycemia--due to infection and stress - A1c 4.5, no evidence of diabetes  6)GERD--c/n  Pepcid    Status is: Inpatient   Disposition: The patient is from: Home              Anticipated d/c is to: Home              Anticipated d/c date is: 2 days              Patient currently is not medically stable to d/c. Barriers: Not Clinically Stable-   Code Status :  -  Code Status: Full Code   Family Communication:    NA (patient is alert, awake and coherent)   DVT Prophylaxis  :   - SCDs  enoxaparin  (LOVENOX ) injection 40 mg Start: 12/30/23 2200   Lab Results  Component Value Date   PLT 133 (L) 01/01/2024    Inpatient Medications  Scheduled Meds:  enoxaparin   (LOVENOX ) injection  40 mg Subcutaneous Q24H   famotidine   20 mg Oral BID   nicotine   21 mg Transdermal Daily   Continuous Infusions:  vancomycin  1,500 mg (01/01/24 0217)   PRN Meds:.acetaminophen  **OR** acetaminophen , ondansetron  **OR** ondansetron  (ZOFRAN ) IV  Anti-infectives (From admission, onward)    Start     Dose/Rate Route Frequency Ordered Stop   12/31/23 1400  vancomycin  (VANCOREADY) IVPB 1500 mg/300 mL        1,500 mg 150 mL/hr over 120 Minutes Intravenous Every 12 hours 12/31/23 1257     12/30/23 2200  ceFAZolin  (ANCEF ) IVPB 2g/100 mL premix        2 g 200 mL/hr over 30 Minutes Intravenous Every 8 hours 12/30/23 1831 12/31/23 1756   12/30/23 1800  vancomycin  (VANCOREADY) IVPB 1750 mg/350 mL        1,750 mg 175 mL/hr over 120 Minutes Intravenous  Once 12/30/23 1745 12/30/23 2009      Subjective: [facility] today has  no emesis,  No chest pain,  -T max 101.7, T current 99 - Abdominal discomfort is not worse  Objective: Vitals:   12/31/23 1316 12/31/23 2005 01/01/24 0436 01/01/24 0530  BP: 118/77 121/68 (!) 141/63   Pulse: 81 75 83   Resp: 16 18 19    Temp: 99.5 F (37.5 C) 98.9 F (37.2 C) (!) 100.9 F (  38.3 C) 99 F (37.2 C)  TempSrc: Oral Oral Oral Oral  SpO2: 100% 100% 99%   Weight:      Height:        Intake/Output Summary (Last 24 hours) at 01/01/2024 1113 Last data filed at 01/01/2024 0830 Gross per 24 hour  Intake 1770.81 ml  Output --  Net 1770.81 ml   Filed Weights   12/30/23 1212 12/30/23 2018  Weight: 81.6 kg 76.2 kg    Physical Exam Gen:- Awake Alert,  in no apparent distress  HEENT:- [place].AT, No sclera icterus, poor dentition Neck-Supple Neck,No JVD,.  Lungs-  CTAB , fair symmetrical air movement CV- S1, S2 normal, regular  Abd-  +ve B.Sounds, Abd Soft, No tenderness,    Extremity:- No  edema, pedal pulses present  Psych-affect is appropriate, oriented x3 Neuro-no new focal deficits, no tremors Skin--Rt Lower Anterior wall and  Upper Thigh area with erythema, warmt, tenderness --area of induration with purulent drainage noted ....  See Photo below--   Media Information  Document Information  Photos  Rt Lower abd and Rt Upper Thigh Cellulitis  12/31/2023 14:39  Attached To:  Hospital Encounter on 12/30/23  Source Information  Colin Dawley, MD  Ap-Dept [facility]  Document History      Media Information  Document Information  Photos  Rt Lower Abd and Rt upper Thigh cellulitis  12/31/2023 14:38  Attached To:  Hospital Encounter on 12/30/23  Source Information  Colin Dawley, MD  Ap-Dept [facility]  Document History    Data Reviewed: I have personally reviewed following labs and imaging studies  CBC: Recent Labs  Lab 12/30/23 1529 12/31/23 0435 01/01/24 0427  WBC 22.2* 19.8* 14.6*  NEUTROABS 17.1*  --   --   HGB 14.6 13.2 12.3*  HCT 43.6 39.4 38.5*  MCV 92.4 91.2 94.1  PLT 179 159 133*   Basic Metabolic Panel: Recent Labs  Lab 12/30/23 1529 12/31/23 0435 01/01/24 0427  NA 130* 133* 134*  K 3.1* 4.0 3.6  CL 94* 102 101  CO2 25 23 24   GLUCOSE 123* 109* 125*  BUN 8 8 7   CREATININE 0.71 0.64 0.58*  CALCIUM  8.9 8.5* 8.3*   GFR: Estimated Creatinine Clearance: 131 mL/min (A) (by C-G formula based on SCr of 0.58 mg/dL (L)).  HbA1C: Recent Labs    12/31/23 0435  HGBA1C 4.5*   Recent Results (from the past 240 hours)  Blood culture (routine x 2)     Status: None (Preliminary result)   Collection Time: 12/30/23  3:35 PM   Specimen: BLOOD  Result Value Ref Range Status   Specimen Description BLOOD  Final   Special Requests NONE  Final   Culture   Final    NO GROWTH 2 DAYS Performed at [facility], [address], [place], [place] [address]    Report Status PENDING  Incomplete  Aerobic Culture w Gram Stain (superficial specimen)     Status: None (Preliminary result)   Collection Time: 12/30/23  3:35 PM   Specimen: Abdomen  Result Value Ref Range Status   Specimen Description    Final    ABDOMEN Performed at [facility], [address] [place], [place] [address]    Special Requests   Final    NONE Performed at [facility], [address], [place], [place] [address]    Gram Stain   Final    RARE WBC PRESENT, PREDOMINANTLY PMN RARE GRAM POSITIVE COCCI    Culture   Final  MODERATE STAPHYLOCOCCUS AUREUS SUSCEPTIBILITIES TO FOLLOW Performed at [facility], [address] [place], [place] [address]    Report Status PENDING  Incomplete  Blood culture (routine x 2)     Status: None (Preliminary result)   Collection Time: 12/30/23  3:40 PM   Specimen: BLOOD  Result Value Ref Range Status   Specimen Description BLOOD  Final   Special Requests NONE  Final   Culture   Final    NO GROWTH 2 DAYS Performed at [facility], [address] [place], [place] [address]    Report Status PENDING  Incomplete  MRSA Next Gen by PCR, Nasal     Status: Abnormal   Collection Time: 12/31/23  1:41 PM   Specimen: Nasal Mucosa; Nasal Swab  Result Value Ref Range Status   MRSA by PCR Next Gen DETECTED (A) NOT DETECTED Final    Comment: RESULT CALLED TO, READ BACK BY AND VERIFIED WITH: B GALLOWAY AT 1604 12/31/23 BY A WILSON (NOTE) The GeneXpert MRSA Assay (FDA approved for NASAL specimens only), is one component of a comprehensive MRSA colonization surveillance program. It is not intended to diagnose MRSA infection nor to guide or monitor treatment for MRSA infections. Test performance is not FDA approved in patients less than 37 years old. Performed at [facility], [address], [place], [place] [address]   Aerobic/Anaerobic Culture w Gram Stain (surgical/deep wound)     Status: None (Preliminary result)   Collection Time: 12/31/23  2:52 PM   Specimen: Wound  Result Value Ref Range Status   Specimen Description   Final    WOUND Performed at [facility], [address] [place], [place] [address]    Special Requests   Final    NONE Performed at [facility], [address], [place], [place] [address]    Gram Stain   Final    RARE WBC PRESENT, PREDOMINANTLY PMN FEW GRAM POSITIVE COCCI    Culture   Final    NO GROWTH < 12 HOURS Performed at [facility], [address] [place], [place] [address]    Report Status PENDING  Incomplete    Radiology Studies: CT ABDOMEN PELVIS W CONTRAST Result Date: 12/30/2023 CLINICAL DATA:  Abdominal wall abscess, pain EXAM: CT ABDOMEN AND PELVIS WITH CONTRAST TECHNIQUE: Multidetector CT imaging of the abdomen and pelvis was performed using the standard protocol following bolus administration of intravenous contrast. RADIATION DOSE REDUCTION: This exam was performed according to the departmental dose-optimization program which includes automated exposure control, adjustment of the mA and/or kV according to patient size and/or use of iterative reconstruction technique. CONTRAST:  OMNIPAQUE  IOHEXOL  300 MG/ML  SOLN COMPARISON:  08/17/2020 FINDINGS: Lower chest: No pleural or pericardial effusion. Visualized lung bases clear. Hepatobiliary: No focal liver abnormality is seen. No gallstones, gallbladder wall thickening, or biliary dilatation. Pancreas: Unremarkable. No pancreatic ductal dilatation or surrounding inflammatory changes. Spleen: Normal in size without focal abnormality. Adrenals/Urinary Tract: Adrenal glands are unremarkable. Kidneys are normal, without renal calculi, focal lesion, or hydronephrosis. Bladder is unremarkable. Stomach/Bowel: Stomach partially distended, without acute finding. Small bowel decompressed. Normal appendix. The colon is partially distended, without acute finding. Vascular/Lymphatic: No AAA. Portal vein patent. Subcentimeter aortocaval and left periaortic lymph nodes. Enhancing enlarged right inguinal and external iliac nodes up to 1.4 cm. Reproductive: Prostate is unremarkable. Other: No abdominal ascites.  No free air. Musculoskeletal: Marked subcutaneous edematous/inflammatory  change in the anterior body wall overlying the right pelvis.  There is some linear fluid extending through the deep subcutaneous tissues but no well-defined abscess pocket. There is overlying skin thickening. IMPRESSION: 1. Marked right pelvic body wall cellulitis without drainable abscess. 2. Regional inguinal and external iliac adenopathy likely reactive. Electronically Signed   By: Nicoletta Barrier M.D.   On: 12/30/2023 18:03   Scheduled Meds:  enoxaparin  (LOVENOX ) injection  40 mg Subcutaneous Q24H   famotidine   20 mg Oral BID   nicotine   21 mg Transdermal Daily   Continuous Infusions:  vancomycin  1,500 mg (01/01/24 0217)    LOS: 2 days   Colin Dawley M.D on 01/01/2024 at 11:13 AM  Go to [email or web address] - for contact info  [facility] - Office  [phone]  If 7PM-7AM, please contact night-coverage [email or web address] 01/01/2024, 11:13 AM

## 2024-01-02 DIAGNOSIS — Z22322 Carrier or suspected carrier of Methicillin resistant Staphylococcus aureus: Secondary | ICD-10-CM

## 2024-01-02 DIAGNOSIS — R739 Hyperglycemia, unspecified: Secondary | ICD-10-CM

## 2024-01-02 DIAGNOSIS — E876 Hypokalemia: Secondary | ICD-10-CM

## 2024-01-02 DIAGNOSIS — Z720 Tobacco use: Secondary | ICD-10-CM

## 2024-01-02 LAB — CBC
HCT: 38.2 % — ABNORMAL LOW (ref 39.0–52.0)
Hemoglobin: 12.7 g/dL — ABNORMAL LOW (ref 13.0–17.0)
MCH: 30.7 pg (ref 26.0–34.0)
MCHC: 33.2 g/dL (ref 30.0–36.0)
MCV: 92.3 fL (ref 80.0–100.0)
Platelets: 157 10*3/uL (ref 150–400)
RBC: 4.14 MIL/uL — ABNORMAL LOW (ref 4.22–5.81)
RDW: 13.3 % (ref 11.5–15.5)
WBC: 9.6 10*3/uL (ref 4.0–10.5)
nRBC: 0 % (ref 0.0–0.2)

## 2024-01-02 LAB — BASIC METABOLIC PANEL WITH GFR
Anion gap: 8 (ref 5–15)
BUN: 5 mg/dL — ABNORMAL LOW (ref 6–20)
CO2: 26 mmol/L (ref 22–32)
Calcium: 8.8 mg/dL — ABNORMAL LOW (ref 8.9–10.3)
Chloride: 105 mmol/L (ref 98–111)
Creatinine, Ser: 0.6 mg/dL — ABNORMAL LOW (ref 0.61–1.24)
GFR, Estimated: >60 mL/min (ref >60)
Glucose, Bld: 122 mg/dL — ABNORMAL HIGH (ref 70–99)
Potassium: 3.6 mmol/L (ref 3.5–5.1)
Sodium: 139 mmol/L (ref 135–145)

## 2024-01-02 MED ORDER — MUPIROCIN 2 % EX OINT
1.0000 | TOPICAL_OINTMENT | Freq: Two times a day (BID) | CUTANEOUS | Status: DC
  Administered 2024-01-02 – 2024-01-04 (×5): 1 via NASAL
  Filled 2024-01-02: qty 22

## 2024-01-02 MED ORDER — CHLORHEXIDINE GLUCONATE CLOTH 2 % EX PADS
6.0000 | MEDICATED_PAD | Freq: Every day | CUTANEOUS | Status: DC
  Administered 2024-01-02 – 2024-01-04 (×3): 6 via TOPICAL

## 2024-01-02 NOTE — Progress Notes (Signed)
 PROGRESS NOTE  Glen West, is a 43 y.o. male, DOB - 1981/05/01, ZOX:[id number]  Admit date - 12/30/2023   Admitting Physician No admitting provider for Glen West encounter.  Outpatient Primary MD for the Glen West is Glen West, No Pcp Per  LOS - 3  Chief Complaint  Glen West presents with   Abscess      Brief Narrative:  - 43 year old male with past medical history notable for tobacco abuse and GERD admitted on 12/30/2023 with right lower abdominal anterior wall and pelvic area cellulitis   -Assessment and Plan: 1)Right anterior lower abdominal wall and pelvic cellulitis---  Nasal swab for MRSA positive CT AP shows Marked right pelvic body wall cellulitis without drainable abscess--reactive inguinal and external iliac adenopathy noted - Currently afebrile - WBCs continue trending down - Adequately responding to treatment. On 12/31/23---I was able to clean the abdominal wound and express purulent contents and send it for culture and Gram stain - Cultures of Glen West's abdominal abscess demonstrating the presence of MRSA - Continue treatment with IV vancomycin  and planning to discharge home on linezolid - MRSA decolonization protocol has been initiated.  2)HypoNatremia- -in the setting of dehydration and acute infection - Continue to maintain adequate hydration - Sodium level has normalized after fluid resuscitation.  3)Hypokalemia-- -electrolytes have been repleted - Continue to follow trend  4)Tobacco Abuse - Smoking cessation counseling has been provided - Continue nicotine  patch.  5)Hyperglycemia--due to infection and stress - A1c 4.5, no evidence of diabetes - Glen West advised to maintain adequate hydration.  6)GERD - Continue Pepcid  - Lifestyle changes discussed with Glen West.  Status is: Inpatient   Disposition: The Glen West is from: Home              Anticipated d/c is to: Home              Anticipated d/c date is: 2 days              Glen West currently is not  medically stable to d/c.  Barriers: Not Clinically Stable-   Code Status :  -  Code Status: Full Code   Family Communication:    NA (Glen West is alert, awake and coherent)   DVT Prophylaxis  :   - SCDs  enoxaparin  (LOVENOX ) injection 40 mg Start: 12/30/23 2200   Lab Results  Component Value Date   PLT 157 01/02/2024    Inpatient Medications  Scheduled Meds:  Chlorhexidine  Gluconate Cloth  6 each Topical Daily   enoxaparin  (LOVENOX ) injection  40 mg Subcutaneous Q24H   famotidine   20 mg Oral BID   mupirocin  ointment  1 Application Nasal BID   nicotine   21 mg Transdermal Daily   Continuous Infusions:  vancomycin  1,500 mg (01/02/24 1610)   PRN Meds:.acetaminophen  **OR** acetaminophen , ondansetron  **OR** ondansetron  (ZOFRAN ) IV  Anti-infectives (From admission, onward)    Start     Dose/Rate Route Frequency Ordered Stop   12/31/23 1400  vancomycin  (VANCOREADY) IVPB 1500 mg/300 mL        1,500 mg 150 mL/hr over 120 Minutes Intravenous Every 12 hours 12/31/23 1257     12/30/23 2200  ceFAZolin  (ANCEF ) IVPB 2g/100 mL premix        2 g 200 mL/hr over 30 Minutes Intravenous Every 8 hours 12/30/23 1831 12/31/23 1756   12/30/23 1800  vancomycin  (VANCOREADY) IVPB 1750 mg/350 mL        1,750 mg 175 mL/hr over 120 Minutes Intravenous  Once 12/30/23 1745 12/30/23 2009      Subjective:  Glen West afebrile on today's examination; low-grade temperature appreciated overnight.  Glen West reports improvement in his right lower anterior abdominal wall erythematous changes, induration and tenderness.  Objective: Vitals:   01/01/24 1405 01/01/24 2048 01/02/24 0415 01/02/24 1300  BP: 133/82 118/64 134/78 133/82  Pulse: 93 77 74 84  Resp: 17 18 18 18   Temp: 98.5 F (36.9 C) 99.4 F (37.4 C) 99.1 F (37.3 C) 99.3 F (37.4 C)  TempSrc:  Oral Oral Oral  SpO2: 100% 99% 100% 100%  Weight:      Height:        Intake/Output Summary (Last 24 hours) at 01/02/2024 1644 Last data filed at  01/02/2024 0441 Gross per 24 hour  Intake 1515.35 ml  Output --  Net 1515.35 ml   Filed Weights   12/30/23 1212 12/30/23 2018  Weight: 81.6 kg 76.2 kg    Physical Exam General exam: Alert, awake, oriented x 3; no fever, no chest pain, no nausea or vomiting. Respiratory system: Clear to auscultation. Respiratory effort normal.  Good saturation on room air. Cardiovascular system:RRR. No rubs or gallops; no JVD. Gastrointestinal system: Abdomen is nondistended, soft and nontender. No organomegaly or masses felt. Normal bowel sounds heard. Central nervous system: No focal neurological deficits. Extremities: No cyanosis or clubbing. Skin: Improvement in right lower anterior abdominal wall erythematous changes, warm sensation and induration appreciated. Psychiatry: Judgement and insight appear normal. Mood & affect appropriate.    Data Reviewed: I have personally reviewed following labs and imaging studies  CBC: Recent Labs  Lab 12/30/23 1529 12/31/23 0435 01/01/24 0427 01/02/24 0431  WBC 22.2* 19.8* 14.6* 9.6  NEUTROABS 17.1*  --   --   --   HGB 14.6 13.2 12.3* 12.7*  HCT 43.6 39.4 38.5* 38.2*  MCV 92.4 91.2 94.1 92.3  PLT 179 159 133* 157   Basic Metabolic Panel: Recent Labs  Lab 12/30/23 1529 12/31/23 0435 01/01/24 0427 01/02/24 0431  NA 130* 133* 134* 139  K 3.1* 4.0 3.6 3.6  CL 94* 102 101 105  CO2 25 23 24 26   GLUCOSE 123* 109* 125* 122*  BUN 8 8 7  5*  CREATININE 0.71 0.64 0.58* 0.60*  CALCIUM  8.9 8.5* 8.3* 8.8*   GFR: Estimated Creatinine Clearance: 131 mL/min (A) (by C-G formula based on SCr of 0.6 mg/dL (L)).  HbA1C: Recent Labs    12/31/23 0435  HGBA1C 4.5*   Recent Results (from the past 240 hours)  Blood culture (routine x 2)     Status: None (Preliminary result)   Collection Time: 12/30/23  3:35 PM   Specimen: BLOOD  Result Value Ref Range Status   Specimen Description BLOOD  Final   Special Requests NONE  Final   Culture   Final    NO  GROWTH 3 DAYS Performed at [facility], [address] [place], [place] [address]    Report Status PENDING  Incomplete  Aerobic Culture w Gram Stain (superficial specimen)     Status: None (Preliminary result)   Collection Time: 12/30/23  3:35 PM   Specimen: Abdomen  Result Value Ref Range Status   Specimen Description   Final    ABDOMEN Performed at [facility], [address], [place], [place] [address]    Special Requests   Final    NONE Performed at [facility], [address] [place], [place] [address]    Gram Stain   Final    RARE WBC PRESENT, PREDOMINANTLY PMN RARE GRAM POSITIVE COCCI  Culture   Final    MODERATE METHICILLIN RESISTANT STAPHYLOCOCCUS AUREUS CULTURE REINCUBATED FOR BETTER GROWTH Performed at [facility], [address] [place], [place] [address]    Report Status PENDING  Incomplete   Organism ID, Bacteria METHICILLIN RESISTANT STAPHYLOCOCCUS AUREUS  Final      Susceptibility   Methicillin resistant staphylococcus aureus - MIC*    CIPROFLOXACIN  >=8 RESISTANT Resistant     ERYTHROMYCIN >=8 RESISTANT Resistant     GENTAMICIN <=0.5 SENSITIVE Sensitive     OXACILLIN >=4 RESISTANT Resistant     TETRACYCLINE <=1 SENSITIVE Sensitive     VANCOMYCIN  1 SENSITIVE Sensitive     TRIMETH/SULFA >=320 RESISTANT Resistant     CLINDAMYCIN <=0.25 SENSITIVE Sensitive     RIFAMPIN <=0.5 SENSITIVE Sensitive     Inducible Clindamycin NEGATIVE Sensitive     LINEZOLID 2 SENSITIVE Sensitive     * MODERATE METHICILLIN RESISTANT STAPHYLOCOCCUS AUREUS  Blood culture (routine x 2)     Status: None (Preliminary result)   Collection Time: 12/30/23  3:40 PM   Specimen: BLOOD  Result Value Ref Range Status   Specimen Description BLOOD  Final   Special Requests NONE  Final   Culture   Final    NO GROWTH 3 DAYS Performed at [facility], [address] [place], [place] [address]    Report Status PENDING  Incomplete  MRSA Next Gen by PCR, Nasal     Status: Abnormal    Collection Time: 12/31/23  1:41 PM   Specimen: Nasal Mucosa; Nasal Swab  Result Value Ref Range Status   MRSA by PCR Next Gen DETECTED (A) NOT DETECTED Final    Comment: RESULT CALLED TO, READ BACK BY AND VERIFIED WITH: B GALLOWAY AT 1604 12/31/23 BY A WILSON (NOTE) The GeneXpert MRSA Assay (FDA approved for NASAL specimens only), is one component of a comprehensive MRSA colonization surveillance program. It is not intended to diagnose MRSA infection nor to guide or monitor treatment for MRSA infections. Test performance is not FDA approved in patients less than 62 years old. Performed at [facility], [address] [place], [place] [address]   Aerobic/Anaerobic Culture w Gram Stain (surgical/deep wound)     Status: None (Preliminary result)   Collection Time: 12/31/23  2:52 PM   Specimen: Wound  Result Value Ref Range Status   Specimen Description   Final    WOUND Performed at [facility], [address], [place], [place] [address]    Special Requests   Final    NONE Performed at [facility], [address], [place], [place] [address]    Gram Stain   Final    RARE WBC PRESENT, PREDOMINANTLY PMN FEW GRAM POSITIVE COCCI Performed at [facility], [address], [place], [place] [address]    Culture   Final    FEW STAPHYLOCOCCUS AUREUS CULTURE REINCUBATED FOR BETTER GROWTH SUSCEPTIBILITIES TO FOLLOW NO ANAEROBES ISOLATED; CULTURE IN PROGRESS FOR 5 DAYS    Report Status PENDING  Incomplete    Radiology Studies: No results found.  Scheduled Meds:  Chlorhexidine  Gluconate Cloth  6 each Topical Daily   enoxaparin  (LOVENOX ) injection  40 mg Subcutaneous Q24H   famotidine   20 mg Oral BID   mupirocin  ointment  1 Application Nasal BID   nicotine   21 mg Transdermal Daily   Continuous Infusions:  vancomycin  1,500 mg (01/02/24 1610)    LOS: 3 days   Justina Oman M.D on 01/02/2024 at 4:44 PM  Go to [email or web address] - for contact info  [facility] - Office   [phone]  If 7PM-7AM, please contact night-coverage [email or web address] 01/02/2024, 4:44 PM

## 2024-01-02 NOTE — TOC Progression Note (Signed)
 Transition of Care [facility]) - Progression Note    Patient Details  Name: Glen West MRN: [id number] Date of Birth: 09/21/1980  Transition of Care [facility]) CM/SW Contact  Orelia Binet, RN Phone Number: 01/02/2024, 10:58 AM  Clinical Narrative:   Patient is on IV antibiotics, not ready for discharge. Patient screened for medicaid, now listed as medicaid potential. Patient has not PCP, list added to AVS. TOC following for DC plan.     Expected Discharge Plan: Home/Self Care Barriers to Discharge: Continued Medical Work up  Expected Discharge Plan and Services       Social Determinants of Health (SDOH) Interventions SDOH Screenings   Food Insecurity: No Food Insecurity (12/30/2023)  Housing: Unknown (12/30/2023)  Transportation Needs: No Transportation Needs (12/30/2023)  Utilities: Not At Risk (12/30/2023)  Financial Resource Strain: Low Risk  (11/11/2018)   [facility]  Physical Activity: Sufficiently Active (11/11/2018)   Received from [facility]  Social Connections: Moderately Isolated (12/30/2023)  Stress: No Stress Concern Present (11/11/2018)   Received from [facility]  Tobacco Use: High Risk (12/30/2023)  Health Literacy: High Risk (12/18/2020)   Received from [facility]    Readmission Risk Interventions     No data to display

## 2024-01-03 LAB — AEROBIC CULTURE W GRAM STAIN (SUPERFICIAL SPECIMEN)

## 2024-01-03 LAB — VANCOMYCIN, TROUGH: Vancomycin Tr: 10 ug/mL — ABNORMAL LOW (ref 15–20)

## 2024-01-03 MED ORDER — CIPROFLOXACIN HCL 250 MG PO TABS
500.0000 mg | ORAL_TABLET | Freq: Two times a day (BID) | ORAL | Status: DC
  Administered 2024-01-03 – 2024-01-04 (×3): 500 mg via ORAL
  Filled 2024-01-03 (×3): qty 2

## 2024-01-03 NOTE — Progress Notes (Signed)
 PROGRESS NOTE  Glen West, is a 43 y.o. male, DOB - 1981/08/30, ZHY:[id number]  Admit date - 12/30/2023   Admitting Physician No admitting provider for patient encounter.  Outpatient Primary MD for the patient is Patient, No Pcp Per  LOS - 4  Chief Complaint  Patient presents with   Abscess      Brief Narrative:  - 43 year old male with past medical history notable for tobacco abuse and GERD admitted on 12/30/2023 with right lower abdominal anterior wall and pelvic area cellulitis   -Assessment and Plan: 1)Right anterior lower abdominal wall and pelvic cellulitis---  Nasal swab for MRSA positive CT AP shows Marked right pelvic body wall cellulitis without drainable abscess--reactive inguinal and external iliac adenopathy noted - Currently afebrile - WBCs continue trending down - Adequately responding to treatment. On 12/31/23---I was able to clean the abdominal wound and express purulent contents and send it for culture and Gram stain - Cultures of patient's abdominal abscess demonstrating the presence of MRSA and Acinetobacter. - Continue treatment with IV vancomycin  and will also add Cipro .  After discussing with ID pharmacist and reviewing sensitivity will plan to discharge on ciprofloxacin  and doxycycline . - MRSA decolonization protocol has been initiated.  2)HypoNatremia- -in the setting of dehydration and acute infection - Continue to maintain adequate hydration and nutrition. - Sodium level has normalized after fluid resuscitation.  3)Hypokalemia-- -electrolytes have been repleted - Continue to follow trend  4)Tobacco Abuse - Smoking cessation counseling provided - Continue nicotine  patch.  5)Hyperglycemia--due to infection and stress - A1c 4.5, no evidence of diabetes - Patient advised to maintain adequate hydration.  6)GERD - Continue Pepcid  - Lifestyle changes discussed with patient.  Status is: Inpatient   Disposition: The patient is from: Home               Anticipated d/c is to: Home              Anticipated d/c date is: 2 days              Patient currently is not medically stable to d/c.  Barriers: Not Clinically Stable-   Code Status :  -  Code Status: Full Code   Family Communication:    NA (patient is alert, awake and coherent)   DVT Prophylaxis  :   - SCDs  enoxaparin  (LOVENOX ) injection 40 mg Start: 12/30/23 2200   Lab Results  Component Value Date   PLT 157 01/02/2024    Inpatient Medications  Scheduled Meds:  Chlorhexidine  Gluconate Cloth  6 each Topical Daily   ciprofloxacin   500 mg Oral BID   enoxaparin  (LOVENOX ) injection  40 mg Subcutaneous Q24H   famotidine   20 mg Oral BID   mupirocin  ointment  1 Application Nasal BID   nicotine   21 mg Transdermal Daily   Continuous Infusions:  vancomycin  1,500 mg (01/03/24 1413)   PRN Meds:.acetaminophen  **OR** acetaminophen , ondansetron  **OR** ondansetron  (ZOFRAN ) IV  Anti-infectives (From admission, onward)    Start     Dose/Rate Route Frequency Ordered Stop   01/03/24 1045  ciprofloxacin  (CIPRO ) tablet 500 mg        500 mg Oral 2 times daily 01/03/24 0958     12/31/23 1400  vancomycin  (VANCOREADY) IVPB 1500 mg/300 mL        1,500 mg 150 mL/hr over 120 Minutes Intravenous Every 12 hours 12/31/23 1257     12/30/23 2200  ceFAZolin  (ANCEF ) IVPB 2g/100 mL premix        2  g 200 mL/hr over 30 Minutes Intravenous Every 8 hours 12/30/23 1831 12/31/23 1756   12/30/23 1800  vancomycin  (VANCOREADY) IVPB 1750 mg/350 mL        1,750 mg 175 mL/hr over 120 Minutes Intravenous  Once 12/30/23 1745 12/30/23 2009      Subjective: Glen West no fever, no chest pain, no nausea vomiting.  Patient reports still having some pain, induration and redness in his right lower quadrant.  Objective: Vitals:   01/02/24 1300 01/02/24 1945 01/03/24 0421 01/03/24 1420  BP: 133/82 115/75 138/78 133/77  Pulse: 84 65 62 (!) 55  Resp: 18 17 17 18   Temp: 99.3 F (37.4 C) 98.7 F (37.1 C)  98.4 F (36.9 C) 97.7 F (36.5 C)  TempSrc: Oral Oral Oral Oral  SpO2: 100% 100% 100% 99%  Weight:      Height:        Intake/Output Summary (Last 24 hours) at 01/03/2024 1806 Last data filed at 01/02/2024 1826 Gross per 24 hour  Intake 480 ml  Output --  Net 480 ml   Filed Weights   12/30/23 1212 12/30/23 2018  Weight: 81.6 kg 76.2 kg    Physical Exam General exam: Alert, awake, oriented x 3; no fever, no chest pain, no nausea vomiting.  Still complaining of right lower quadrant abdomen pain and induration. Respiratory system: Clear to auscultation. Respiratory effort normal.  Good saturation on room air. Cardiovascular system:RRR. No murmurs, rubs, gallops. Gastrointestinal system: Abdomen is soft and without guarding; still having some induration and discomfort in his right lower quadrant on palpation.  Positive bowel sounds. Central nervous system: No focal neurological deficits. Extremities: No C/C/E, +pedal pulses Skin: demonstrating improvement in overall erythematous changes in right lower quadrant; still some induration and minimal drainage appreciated. Psychiatry: Judgement and insight appear normal. Mood & affect appropriate.   Data Reviewed: I have personally reviewed following labs and imaging studies  CBC: Recent Labs  Lab 12/30/23 1529 12/31/23 0435 01/01/24 0427 01/02/24 0431  WBC 22.2* 19.8* 14.6* 9.6  NEUTROABS 17.1*  --   --   --   HGB 14.6 13.2 12.3* 12.7*  HCT 43.6 39.4 38.5* 38.2*  MCV 92.4 91.2 94.1 92.3  PLT 179 159 133* 157   Basic Metabolic Panel: Recent Labs  Lab 12/30/23 1529 12/31/23 0435 01/01/24 0427 01/02/24 0431  NA 130* 133* 134* 139  K 3.1* 4.0 3.6 3.6  CL 94* 102 101 105  CO2 25 23 24 26   GLUCOSE 123* 109* 125* 122*  BUN 8 8 7  5*  CREATININE 0.71 0.64 0.58* 0.60*  CALCIUM  8.9 8.5* 8.3* 8.8*   GFR: Estimated Creatinine Clearance: 131 mL/min (A) (by C-G formula based on SCr of 0.6 mg/dL (L)).  HbA1C: No results for  input(s): "HGBA1C" in the last 72 hours.  Recent Results (from the past 240 hours)  Blood culture (routine x 2)     Status: None (Preliminary result)   Collection Time: 12/30/23  3:35 PM   Specimen: BLOOD  Result Value Ref Range Status   Specimen Description BLOOD  Final   Special Requests NONE  Final   Culture   Final    NO GROWTH 4 DAYS Performed at [facility], [address] [place], [place] [address]    Report Status PENDING  Incomplete  Aerobic Culture w Gram Stain (superficial specimen)     Status: None   Collection Time: 12/30/23  3:35 PM   Specimen: Abdomen  Result Value Ref Range Status  Specimen Description   Final    ABDOMEN Performed at [facility], [address] [place], [place] [address]    Special Requests   Final    NONE Performed at [facility], [address] [place], [place] [address]    Gram Stain   Final    RARE WBC PRESENT, PREDOMINANTLY PMN RARE GRAM POSITIVE COCCI Performed at [facility], [address] [place], [place] [address]    Culture   Final    MODERATE METHICILLIN RESISTANT STAPHYLOCOCCUS AUREUS RARE ACINETOBACTER CALCOACETICUS/BAUMANNII COMPLEX    Report Status 01/03/2024 FINAL  Final   Organism ID, Bacteria METHICILLIN RESISTANT STAPHYLOCOCCUS AUREUS  Final   Organism ID, Bacteria ACINETOBACTER CALCOACETICUS/BAUMANNII COMPLEX  Final      Susceptibility   Acinetobacter calcoaceticus/baumannii complex - MIC*    CEFTAZIDIME 4 SENSITIVE Sensitive     CIPROFLOXACIN  0.5 SENSITIVE Sensitive     GENTAMICIN <=1 SENSITIVE Sensitive     IMIPENEM <=0.25 SENSITIVE Sensitive     PIP/TAZO <=4 SENSITIVE Sensitive ug/mL    TRIMETH/SULFA <=20 SENSITIVE Sensitive     AMPICILLIN/SULBACTAM <=2 SENSITIVE Sensitive     * RARE ACINETOBACTER CALCOACETICUS/BAUMANNII COMPLEX   Methicillin resistant staphylococcus aureus - MIC*    CIPROFLOXACIN  >=8 RESISTANT Resistant     ERYTHROMYCIN >=8 RESISTANT Resistant     GENTAMICIN <=0.5 SENSITIVE  Sensitive     OXACILLIN >=4 RESISTANT Resistant     TETRACYCLINE <=1 SENSITIVE Sensitive     VANCOMYCIN  1 SENSITIVE Sensitive     TRIMETH/SULFA >=320 RESISTANT Resistant     CLINDAMYCIN <=0.25 SENSITIVE Sensitive     RIFAMPIN <=0.5 SENSITIVE Sensitive     Inducible Clindamycin NEGATIVE Sensitive     LINEZOLID 2 SENSITIVE Sensitive     * MODERATE METHICILLIN RESISTANT STAPHYLOCOCCUS AUREUS  Blood culture (routine x 2)     Status: None (Preliminary result)   Collection Time: 12/30/23  3:40 PM   Specimen: BLOOD  Result Value Ref Range Status   Specimen Description BLOOD  Final   Special Requests NONE  Final   Culture   Final    NO GROWTH 4 DAYS Performed at [facility], [address] [place], [place] [address]    Report Status PENDING  Incomplete  MRSA Next Gen by PCR, Nasal     Status: Abnormal   Collection Time: 12/31/23  1:41 PM   Specimen: Nasal Mucosa; Nasal Swab  Result Value Ref Range Status   MRSA by PCR Next Gen DETECTED (A) NOT DETECTED Final    Comment: RESULT CALLED TO, READ BACK BY AND VERIFIED WITH: B GALLOWAY AT 1604 12/31/23 BY A WILSON (NOTE) The GeneXpert MRSA Assay (FDA approved for NASAL specimens only), is one component of a comprehensive MRSA colonization surveillance program. It is not intended to diagnose MRSA infection nor to guide or monitor treatment for MRSA infections. Test performance is not FDA approved in patients less than 55 years old. Performed at [facility], [address] [place], [place] [address]   Aerobic/Anaerobic Culture w Gram Stain (surgical/deep wound)     Status: None (Preliminary result)   Collection Time: 12/31/23  2:52 PM   Specimen: Wound  Result Value Ref Range Status   Specimen Description   Final    WOUND Performed at [facility], [address] [place], [place] [address]    Special Requests   Final    NONE Performed at [facility], [address] [place], [place] [address]    Gram Stain  Final    RARE WBC  PRESENT, PREDOMINANTLY PMN FEW GRAM POSITIVE COCCI Performed at [facility], [address], [place], [place] [address]    Culture   Final    FEW METHICILLIN RESISTANT STAPHYLOCOCCUS AUREUS NO ANAEROBES ISOLATED; CULTURE IN PROGRESS FOR 5 DAYS    Report Status PENDING  Incomplete   Organism ID, Bacteria METHICILLIN RESISTANT STAPHYLOCOCCUS AUREUS  Final      Susceptibility   Methicillin resistant staphylococcus aureus - MIC*    CIPROFLOXACIN  >=8 RESISTANT Resistant     ERYTHROMYCIN >=8 RESISTANT Resistant     GENTAMICIN <=0.5 SENSITIVE Sensitive     OXACILLIN >=4 RESISTANT Resistant     TETRACYCLINE <=1 SENSITIVE Sensitive     VANCOMYCIN  1 SENSITIVE Sensitive     TRIMETH/SULFA >=320 RESISTANT Resistant     CLINDAMYCIN <=0.25 SENSITIVE Sensitive     RIFAMPIN <=0.5 SENSITIVE Sensitive     Inducible Clindamycin NEGATIVE Sensitive     LINEZOLID 2 SENSITIVE Sensitive     * FEW METHICILLIN RESISTANT STAPHYLOCOCCUS AUREUS    Radiology Studies: No results found.  Scheduled Meds:  Chlorhexidine  Gluconate Cloth  6 each Topical Daily   ciprofloxacin   500 mg Oral BID   enoxaparin  (LOVENOX ) injection  40 mg Subcutaneous Q24H   famotidine   20 mg Oral BID   mupirocin  ointment  1 Application Nasal BID   nicotine   21 mg Transdermal Daily   Continuous Infusions:  vancomycin  1,500 mg (01/03/24 1413)    LOS: 4 days   Justina Oman M.D on 01/03/2024 at 6:06 PM  Go to [email or web address] - for contact info  [facility] - Office  [phone]  If 7PM-7AM, please contact night-coverage [email or web address] 01/03/2024, 6:06 PM

## 2024-01-03 NOTE — Plan of Care (Signed)

## 2024-01-04 LAB — CULTURE, BLOOD (ROUTINE X 2)
Culture: NO GROWTH
Culture: NO GROWTH

## 2024-01-04 MED ORDER — PANTOPRAZOLE SODIUM 40 MG PO TBEC: 40.0000 mg | DELAYED_RELEASE_TABLET | Freq: Every day | ORAL | 1 refills | Status: AC

## 2024-01-04 MED ORDER — CIPROFLOXACIN HCL 500 MG PO TABS: 500.0000 mg | ORAL_TABLET | Freq: Two times a day (BID) | ORAL | 0 refills | Status: AC

## 2024-01-04 MED ORDER — IBUPROFEN 400 MG PO TABS: 400.0000 mg | ORAL_TABLET | Freq: Three times a day (TID) | ORAL | 0 refills | Status: AC | PRN

## 2024-01-04 MED ORDER — DOXYCYCLINE HYCLATE 100 MG PO TABS: 100.0000 mg | ORAL_TABLET | Freq: Two times a day (BID) | ORAL | 0 refills | Status: AC

## 2024-01-04 NOTE — Discharge Summary (Signed)
 Physician Discharge Summary   Patient: Glen West MRN: [id number] DOB: 05-14-1981  Admit date:     12/30/2023  Discharge date: 01/04/24  Discharge Physician: Justina Oman   PCP: Patient, No Pcp Per   Recommendations at discharge:  Repeat basic metabolic panel to follow electrolytes and renal function Repeat CBC to follow hemoglobin/WBCs trend and instability Continue assisting patient with tobacco cessation Reassess complete resolution of right lower abdominal wall cellulitis process.   Discharge Diagnoses: Principal Problem:   Abdominal wall cellulitis Active Problems:   Hypokalemia   Hyperglycemia   Tobacco abuse  Brief Hospital admission narrative: As per H&P written by Dr. Cathyann Cobia on 12/30/2023 CAMILLE THAU is a 43 y.o. male with medical history significant of no medical problems.  He reports a pimple that started on Thursday.  He squeezed it and got some pus out.  Since then, it has become more red and tender.  It is acutely worse over the last 12 hours.  Due to the increase in size, he presents to the hospital for evaluation.  Denies fevers, chills, nausea, vomiting, abdominal pain, decreased appetite.   Assessment and Plan: 1)Right anterior lower abdominal wall and pelvic cellulitis---  Nasal swab for MRSA positive CT AP shows Marked right pelvic body wall cellulitis without drainable abscess--reactive inguinal and external iliac adenopathy noted - WBCs within normal limits and patient afebrile at discharge. - Cultures of patient's abdominal wall abscess demonstrating the presence of MRSA and Acinetobacter. - Excellent response to IV vancomycin  and ciprofloxacin  -After discussing with ID pharmacist and reviewing sensitivity will plan to discharge on ciprofloxacin  and doxycycline . -MRSA decolonization protocol with Bactroban  and chlorhexidine  provided while inpatient.   2)HypoNatremia- -in the setting of dehydration and acute infection - Continue to maintain  adequate hydration and nutrition. - Sodium level has normalized after fluid resuscitation.   3)Hypokalemia-- -electrolytes have been repleted - Continue to follow trend   4)Tobacco Abuse - Smoking cessation counseling provided - Continue nicotine  patch.   5)Hyperglycemia--due to infection and stress - A1c 4.5, no evidence of diabetes - Patient advised to maintain adequate hydration.   6)GERD - Continue Pepcid  - Lifestyle changes discussed with patient.  Consultants: None Procedures performed: See below for x-ray reports. Disposition: Home Diet recommendation: Regular diet.  DISCHARGE MEDICATION: Allergies as of 01/04/2024   No Known Allergies      Medication List     TAKE these medications    ciprofloxacin  500 MG tablet Commonly known as: CIPRO  Take 1 tablet (500 mg total) by mouth 2 (two) times daily for 7 days.   doxycycline  100 MG tablet Commonly known as: VIBRA -TABS Take 1 tablet (100 mg total) by mouth 2 (two) times daily for 7 days.   ibuprofen 400 MG tablet Commonly known as: ADVIL Take 1 tablet (400 mg total) by mouth every 8 (eight) hours as needed.   pantoprazole 40 MG tablet Commonly known as: Protonix Take 1 tablet (40 mg total) by mouth daily.               Discharge Care Instructions  (From admission, onward)           Start     Ordered   01/04/24 0000  Discharge wound care:       Comments: Keep area clean and dry.   01/04/24 1232            Discharge Exam: Filed Weights   12/30/23 1212 12/30/23 2018  Weight: 81.6 kg 76.2 kg   General  exam: Alert, awake, oriented x 3; no fever, no chest pain, no nausea vomiting.  Still complaining of right lower quadrant abdomen pain and induration. Respiratory system: Clear to auscultation. Respiratory effort normal.  Good saturation on room air. Cardiovascular system:RRR. No murmurs, rubs, gallops. Gastrointestinal system: Abdomen is soft and without guarding; still having some  induration and discomfort in his right lower quadrant on palpation.  Positive bowel sounds. Central nervous system: No focal neurological deficits. Extremities: No C/C/E, +pedal pulses Skin: demonstrating improvement in overall erythematous changes in right lower quadrant; still some induration and minimal drainage appreciated. Psychiatry: Judgement and insight appear normal. Mood & affect appropriate.   Condition at discharge: Stable and improved.  The results of significant diagnostics from this hospitalization (including imaging, microbiology, ancillary and laboratory) are listed below for reference.   Imaging Studies: CT ABDOMEN PELVIS W CONTRAST Result Date: 12/30/2023 CLINICAL DATA:  Abdominal wall abscess, pain EXAM: CT ABDOMEN AND PELVIS WITH CONTRAST TECHNIQUE: Multidetector CT imaging of the abdomen and pelvis was performed using the standard protocol following bolus administration of intravenous contrast. RADIATION DOSE REDUCTION: This exam was performed according to the departmental dose-optimization program which includes automated exposure control, adjustment of the mA and/or kV according to patient size and/or use of iterative reconstruction technique. CONTRAST:  OMNIPAQUE  IOHEXOL  300 MG/ML  SOLN COMPARISON:  08/17/2020 FINDINGS: Lower chest: No pleural or pericardial effusion. Visualized lung bases clear. Hepatobiliary: No focal liver abnormality is seen. No gallstones, gallbladder wall thickening, or biliary dilatation. Pancreas: Unremarkable. No pancreatic ductal dilatation or surrounding inflammatory changes. Spleen: Normal in size without focal abnormality. Adrenals/Urinary Tract: Adrenal glands are unremarkable. Kidneys are normal, without renal calculi, focal lesion, or hydronephrosis. Bladder is unremarkable. Stomach/Bowel: Stomach partially distended, without acute finding. Small bowel decompressed. Normal appendix. The colon is partially distended, without acute finding.  Vascular/Lymphatic: No AAA. Portal vein patent. Subcentimeter aortocaval and left periaortic lymph nodes. Enhancing enlarged right inguinal and external iliac nodes up to 1.4 cm. Reproductive: Prostate is unremarkable. Other: No abdominal ascites.  No free air. Musculoskeletal: Marked subcutaneous edematous/inflammatory change in the anterior body wall overlying the right pelvis. There is some linear fluid extending through the deep subcutaneous tissues but no well-defined abscess pocket. There is overlying skin thickening. IMPRESSION: 1. Marked right pelvic body wall cellulitis without drainable abscess. 2. Regional inguinal and external iliac adenopathy likely reactive. Electronically Signed   By: Nicoletta Barrier M.D.   On: 12/30/2023 18:03    Microbiology: Results for orders placed or performed during the hospital encounter of 12/30/23  Blood culture (routine x 2)     Status: None   Collection Time: 12/30/23  3:35 PM   Specimen: BLOOD  Result Value Ref Range Status   Specimen Description BLOOD  Final   Special Requests NONE  Final   Culture   Final    NO GROWTH 5 DAYS Performed at [facility], [address], [place], [place] [address]    Report Status 01/04/2024 FINAL  Final  Aerobic Culture w Gram Stain (superficial specimen)     Status: None   Collection Time: 12/30/23  3:35 PM   Specimen: Abdomen  Result Value Ref Range Status   Specimen Description   Final    ABDOMEN Performed at [facility], [address], [place], [place] [address]    Special Requests   Final    NONE Performed at [facility], [address], [place], [place] [address]    Gram Stain   Final  RARE WBC PRESENT, PREDOMINANTLY PMN RARE GRAM POSITIVE COCCI Performed at [facility], [address] [place], [place] [address]    Culture   Final    MODERATE METHICILLIN RESISTANT STAPHYLOCOCCUS AUREUS RARE ACINETOBACTER CALCOACETICUS/BAUMANNII COMPLEX    Report Status 01/03/2024 FINAL  Final   Organism ID,  Bacteria METHICILLIN RESISTANT STAPHYLOCOCCUS AUREUS  Final   Organism ID, Bacteria ACINETOBACTER CALCOACETICUS/BAUMANNII COMPLEX  Final      Susceptibility   Acinetobacter calcoaceticus/baumannii complex - MIC*    CEFTAZIDIME 4 SENSITIVE Sensitive     CIPROFLOXACIN  0.5 SENSITIVE Sensitive     GENTAMICIN <=1 SENSITIVE Sensitive     IMIPENEM <=0.25 SENSITIVE Sensitive     PIP/TAZO <=4 SENSITIVE Sensitive ug/mL    TRIMETH/SULFA <=20 SENSITIVE Sensitive     AMPICILLIN/SULBACTAM <=2 SENSITIVE Sensitive     * RARE ACINETOBACTER CALCOACETICUS/BAUMANNII COMPLEX   Methicillin resistant staphylococcus aureus - MIC*    CIPROFLOXACIN  >=8 RESISTANT Resistant     ERYTHROMYCIN >=8 RESISTANT Resistant     GENTAMICIN <=0.5 SENSITIVE Sensitive     OXACILLIN >=4 RESISTANT Resistant     TETRACYCLINE <=1 SENSITIVE Sensitive     VANCOMYCIN  1 SENSITIVE Sensitive     TRIMETH/SULFA >=320 RESISTANT Resistant     CLINDAMYCIN <=0.25 SENSITIVE Sensitive     RIFAMPIN <=0.5 SENSITIVE Sensitive     Inducible Clindamycin NEGATIVE Sensitive     LINEZOLID 2 SENSITIVE Sensitive     * MODERATE METHICILLIN RESISTANT STAPHYLOCOCCUS AUREUS  Blood culture (routine x 2)     Status: None   Collection Time: 12/30/23  3:40 PM   Specimen: BLOOD  Result Value Ref Range Status   Specimen Description BLOOD  Final   Special Requests NONE  Final   Culture   Final    NO GROWTH 5 DAYS Performed at [facility], [address], [place], [place] [address]    Report Status 01/04/2024 FINAL  Final  MRSA Next Gen by PCR, Nasal     Status: Abnormal   Collection Time: 12/31/23  1:41 PM   Specimen: Nasal Mucosa; Nasal Swab  Result Value Ref Range Status   MRSA by PCR Next Gen DETECTED (A) NOT DETECTED Final    Comment: RESULT CALLED TO, READ BACK BY AND VERIFIED WITH: B GALLOWAY AT 1604 12/31/23 BY A WILSON (NOTE) The GeneXpert MRSA Assay (FDA approved for NASAL specimens only), is one component of a comprehensive MRSA colonization  surveillance program. It is not intended to diagnose MRSA infection nor to guide or monitor treatment for MRSA infections. Test performance is not FDA approved in patients less than 34 years old. Performed at [facility], [address] [place], [place] [address]   Aerobic/Anaerobic Culture w Gram Stain (surgical/deep wound)     Status: None (Preliminary result)   Collection Time: 12/31/23  2:52 PM   Specimen: Wound  Result Value Ref Range Status   Specimen Description   Final    WOUND Performed at [facility], [address] [place], [place] [address]    Special Requests   Final    NONE Performed at [facility], [address] [place], [place] [address]    Gram Stain   Final    RARE WBC PRESENT, PREDOMINANTLY PMN FEW GRAM POSITIVE COCCI Performed at [facility], [address], [place], [place] [address]    Culture   Final    FEW METHICILLIN RESISTANT STAPHYLOCOCCUS AUREUS NO ANAEROBES ISOLATED; CULTURE IN PROGRESS FOR 5 DAYS  Report Status PENDING  Incomplete   Organism ID, Bacteria METHICILLIN RESISTANT STAPHYLOCOCCUS AUREUS  Final      Susceptibility   Methicillin resistant staphylococcus aureus - MIC*    CIPROFLOXACIN  >=8 RESISTANT Resistant     ERYTHROMYCIN >=8 RESISTANT Resistant     GENTAMICIN <=0.5 SENSITIVE Sensitive     OXACILLIN >=4 RESISTANT Resistant     TETRACYCLINE <=1 SENSITIVE Sensitive     VANCOMYCIN  1 SENSITIVE Sensitive     TRIMETH/SULFA >=320 RESISTANT Resistant     CLINDAMYCIN <=0.25 SENSITIVE Sensitive     RIFAMPIN <=0.5 SENSITIVE Sensitive     Inducible Clindamycin NEGATIVE Sensitive     LINEZOLID 2 SENSITIVE Sensitive     * FEW METHICILLIN RESISTANT STAPHYLOCOCCUS AUREUS    Labs: CBC: Recent Labs  Lab 12/30/23 1529 12/31/23 0435 01/01/24 0427 01/02/24 0431  WBC 22.2* 19.8* 14.6* 9.6  NEUTROABS 17.1*  --   --   --   HGB 14.6 13.2 12.3* 12.7*  HCT 43.6 39.4 38.5* 38.2*  MCV 92.4 91.2 94.1 92.3  PLT 179 159 133* 157    Basic Metabolic Panel: Recent Labs  Lab 12/30/23 1529 12/31/23 0435 01/01/24 0427 01/02/24 0431  NA 130* 133* 134* 139  K 3.1* 4.0 3.6 3.6  CL 94* 102 101 105  CO2 25 23 24 26   GLUCOSE 123* 109* 125* 122*  BUN 8 8 7  5*  CREATININE 0.71 0.64 0.58* 0.60*  CALCIUM  8.9 8.5* 8.3* 8.8*    Discharge time spent: greater than 30 minutes.  Signed: Justina Oman, MD [facility] 01/04/2024

## 2024-01-05 LAB — AEROBIC/ANAEROBIC CULTURE W GRAM STAIN (SURGICAL/DEEP WOUND)
